# Patient Record
Sex: Female | Born: 1935 | Race: White | Hispanic: No | Marital: Single | Smoking: Never smoker
Health system: Southern US, Community
[De-identification: ages and names within clinical notes are randomized; demographics above are authoritative.]

## PROBLEM LIST (undated history)

## (undated) DIAGNOSIS — K909 Intestinal malabsorption, unspecified: Secondary | ICD-10-CM

## (undated) DIAGNOSIS — I4891 Unspecified atrial fibrillation: Secondary | ICD-10-CM

## (undated) DIAGNOSIS — E079 Disorder of thyroid, unspecified: Secondary | ICD-10-CM

## (undated) DIAGNOSIS — M1990 Unspecified osteoarthritis, unspecified site: Secondary | ICD-10-CM

## (undated) DIAGNOSIS — D631 Anemia in chronic kidney disease: Secondary | ICD-10-CM

## (undated) DIAGNOSIS — D509 Iron deficiency anemia, unspecified: Secondary | ICD-10-CM

## (undated) DIAGNOSIS — I10 Essential (primary) hypertension: Secondary | ICD-10-CM

## (undated) DIAGNOSIS — I5189 Other ill-defined heart diseases: Secondary | ICD-10-CM

## (undated) DIAGNOSIS — E7800 Pure hypercholesterolemia, unspecified: Secondary | ICD-10-CM

## (undated) DIAGNOSIS — M109 Gout, unspecified: Secondary | ICD-10-CM

## (undated) DIAGNOSIS — H9190 Unspecified hearing loss, unspecified ear: Secondary | ICD-10-CM

## (undated) DIAGNOSIS — D696 Thrombocytopenia, unspecified: Secondary | ICD-10-CM

## (undated) DIAGNOSIS — E119 Type 2 diabetes mellitus without complications: Secondary | ICD-10-CM

## (undated) DIAGNOSIS — I350 Nonrheumatic aortic (valve) stenosis: Secondary | ICD-10-CM

## (undated) HISTORY — PX: DOPPLER ECHOCARDIOGRAPHY: SHX263

## (undated) HISTORY — DX: Type 2 diabetes mellitus without complications: E11.9

## (undated) HISTORY — DX: Essential (primary) hypertension: I10

## (undated) HISTORY — DX: Intestinal malabsorption, unspecified: K90.9

## (undated) HISTORY — DX: Nonrheumatic aortic (valve) stenosis: I35.0

## (undated) HISTORY — DX: Other ill-defined heart diseases: I51.89

## (undated) HISTORY — DX: Iron deficiency anemia, unspecified: D50.9

## (undated) HISTORY — DX: Anemia in chronic kidney disease: D63.1

## (undated) HISTORY — PX: OTHER SURGICAL HISTORY: SHX169

---

## 1997-10-12 ENCOUNTER — Other Ambulatory Visit: Admission: RE | Admit: 1997-10-12 | Discharge: 1997-10-12 | Payer: Self-pay | Admitting: Family Medicine

## 2008-02-10 ENCOUNTER — Encounter: Payer: Self-pay | Admitting: *Deleted

## 2009-02-24 ENCOUNTER — Emergency Department (HOSPITAL_BASED_OUTPATIENT_CLINIC_OR_DEPARTMENT_OTHER): Admission: EM | Admit: 2009-02-24 | Discharge: 2009-02-25 | Payer: Self-pay | Admitting: Emergency Medicine

## 2009-03-12 ENCOUNTER — Other Ambulatory Visit (HOSPITAL_COMMUNITY): Payer: Self-pay | Admitting: Emergency Medicine

## 2009-03-12 ENCOUNTER — Inpatient Hospital Stay (HOSPITAL_COMMUNITY): Admission: RE | Admit: 2009-03-12 | Discharge: 2009-03-16 | Payer: Self-pay | Admitting: Internal Medicine

## 2009-03-12 ENCOUNTER — Other Ambulatory Visit: Payer: Self-pay | Admitting: Emergency Medicine

## 2009-03-12 ENCOUNTER — Encounter (INDEPENDENT_AMBULATORY_CARE_PROVIDER_SITE_OTHER): Payer: Self-pay | Admitting: Internal Medicine

## 2009-05-16 ENCOUNTER — Emergency Department (HOSPITAL_BASED_OUTPATIENT_CLINIC_OR_DEPARTMENT_OTHER): Admission: EM | Admit: 2009-05-16 | Discharge: 2009-05-16 | Payer: Self-pay | Admitting: Emergency Medicine

## 2009-05-16 ENCOUNTER — Ambulatory Visit: Payer: Self-pay | Admitting: Radiology

## 2009-05-21 ENCOUNTER — Encounter: Admission: RE | Admit: 2009-05-21 | Discharge: 2009-05-30 | Payer: Self-pay | Admitting: Orthopedic Surgery

## 2010-02-07 ENCOUNTER — Inpatient Hospital Stay (HOSPITAL_COMMUNITY): Admission: EM | Admit: 2010-02-07 | Discharge: 2009-07-03 | Payer: Self-pay | Admitting: Emergency Medicine

## 2010-03-24 ENCOUNTER — Encounter: Payer: Self-pay | Admitting: Family Medicine

## 2010-05-19 LAB — CBC
Hemoglobin: 11.1 g/dL — ABNORMAL LOW (ref 12.0–15.0)
Hemoglobin: 12.8 g/dL (ref 12.0–15.0)
MCHC: 34.1 g/dL (ref 30.0–36.0)
MCHC: 34.3 g/dL (ref 30.0–36.0)
MCV: 99 fL (ref 78.0–100.0)
Platelets: 170 10*3/uL (ref 150–400)
RBC: 3.39 MIL/uL — ABNORMAL LOW (ref 3.87–5.11)
RDW: 13.3 % (ref 11.5–15.5)
WBC: 4.8 10*3/uL (ref 4.0–10.5)
WBC: 6 10*3/uL (ref 4.0–10.5)

## 2010-05-19 LAB — COMPREHENSIVE METABOLIC PANEL
ALT: 19 U/L (ref 0–35)
ALT: 8 U/L (ref 0–35)
AST: 11 U/L (ref 0–37)
Albumin: 3.2 g/dL — ABNORMAL LOW (ref 3.5–5.2)
Alkaline Phosphatase: 79 U/L (ref 39–117)
BUN: 57 mg/dL — ABNORMAL HIGH (ref 6–23)
BUN: 72 mg/dL — ABNORMAL HIGH (ref 6–23)
CO2: 15 mEq/L — ABNORMAL LOW (ref 19–32)
Calcium: 8.6 mg/dL (ref 8.4–10.5)
GFR calc Af Amer: 14 mL/min — ABNORMAL LOW (ref >60)
GFR calc non Af Amer: 12 mL/min — ABNORMAL LOW (ref >60)
GFR calc non Af Amer: 13 mL/min — ABNORMAL LOW (ref >60)
GFR calc non Af Amer: 20 mL/min — ABNORMAL LOW (ref >60)
Glucose, Bld: 65 mg/dL — ABNORMAL LOW (ref 70–99)
Glucose, Bld: 65 mg/dL — ABNORMAL LOW (ref 70–99)
Glucose, Bld: 80 mg/dL (ref 70–99)
Potassium: 4.8 mEq/L (ref 3.5–5.1)
Potassium: 5.1 mEq/L (ref 3.5–5.1)
Sodium: 138 mEq/L (ref 135–145)
Total Bilirubin: 0.6 mg/dL (ref 0.3–1.2)
Total Bilirubin: 1.1 mg/dL (ref 0.3–1.2)
Total Protein: 5.4 g/dL — ABNORMAL LOW (ref 6.0–8.3)

## 2010-05-19 LAB — GLUCOSE, CAPILLARY
Glucose-Capillary: 103 mg/dL — ABNORMAL HIGH (ref 70–99)
Glucose-Capillary: 111 mg/dL — ABNORMAL HIGH (ref 70–99)
Glucose-Capillary: 111 mg/dL — ABNORMAL HIGH (ref 70–99)
Glucose-Capillary: 65 mg/dL — ABNORMAL LOW (ref 70–99)
Glucose-Capillary: 65 mg/dL — ABNORMAL LOW (ref 70–99)
Glucose-Capillary: 66 mg/dL — ABNORMAL LOW (ref 70–99)
Glucose-Capillary: 71 mg/dL (ref 70–99)
Glucose-Capillary: 79 mg/dL (ref 70–99)
Glucose-Capillary: 82 mg/dL (ref 70–99)
Glucose-Capillary: 90 mg/dL (ref 70–99)
Glucose-Capillary: 94 mg/dL (ref 70–99)
Glucose-Capillary: 97 mg/dL (ref 70–99)

## 2010-05-19 LAB — URINALYSIS, ROUTINE W REFLEX MICROSCOPIC
Glucose, UA: NEGATIVE mg/dL
Hgb urine dipstick: NEGATIVE
Ketones, ur: 15 mg/dL — AB
Ketones, ur: 15 mg/dL — AB
Nitrite: NEGATIVE
Protein, ur: NEGATIVE mg/dL
Specific Gravity, Urine: 1.024 (ref 1.005–1.030)
Urobilinogen, UA: 0.2 mg/dL (ref 0.0–1.0)
pH: 5 (ref 5.0–8.0)

## 2010-05-19 LAB — BASIC METABOLIC PANEL
BUN: 26 mg/dL — ABNORMAL HIGH (ref 6–23)
CO2: 23 mEq/L (ref 19–32)
CO2: 24 mEq/L (ref 19–32)
Calcium: 8.8 mg/dL (ref 8.4–10.5)
Chloride: 110 mEq/L (ref 96–112)
Chloride: 115 mEq/L — ABNORMAL HIGH (ref 96–112)
Creatinine, Ser: 1.67 mg/dL — ABNORMAL HIGH (ref 0.4–1.2)
GFR calc Af Amer: 36 mL/min — ABNORMAL LOW (ref >60)
GFR calc non Af Amer: 40 mL/min — ABNORMAL LOW (ref >60)
Glucose, Bld: 93 mg/dL (ref 70–99)
Glucose, Bld: 93 mg/dL (ref 70–99)
Potassium: 4 mEq/L (ref 3.5–5.1)
Potassium: 4.2 mEq/L (ref 3.5–5.1)
Sodium: 138 mEq/L (ref 135–145)
Sodium: 141 mEq/L (ref 135–145)
Sodium: 144 mEq/L (ref 135–145)

## 2010-05-19 LAB — PHOSPHORUS: Phosphorus: 3.3 mg/dL (ref 2.3–4.6)

## 2010-05-19 LAB — DIFFERENTIAL
Basophils Absolute: 0 10*3/uL (ref 0.0–0.1)
Basophils Absolute: 0 10*3/uL (ref 0.0–0.1)
Eosinophils Absolute: 0.2 10*3/uL (ref 0.0–0.7)
Eosinophils Relative: 4 % (ref 0–5)
Lymphs Abs: 1.1 10*3/uL (ref 0.7–4.0)
Monocytes Absolute: 0.5 10*3/uL (ref 0.1–1.0)
Monocytes Relative: 9 % (ref 3–12)
Neutro Abs: 3.2 10*3/uL (ref 1.7–7.7)
Neutro Abs: 4.1 10*3/uL (ref 1.7–7.7)
Neutrophils Relative %: 64 % (ref 43–77)
Neutrophils Relative %: 69 % (ref 43–77)

## 2010-05-19 LAB — PROTEIN ELECTROPHORESIS, SERUM
Alpha-1-Globulin: 5.4 % — ABNORMAL HIGH (ref 2.9–4.9)
Alpha-2-Globulin: 15.2 % — ABNORMAL HIGH (ref 7.1–11.8)
Beta 2: 4.9 % (ref 3.2–6.5)

## 2010-05-19 LAB — CARDIAC PANEL(CRET KIN+CKTOT+MB+TROPI)
CK, MB: 1.8 ng/mL (ref 0.3–4.0)
Relative Index: INVALID (ref 0.0–2.5)
Total CK: 33 U/L (ref 7–177)

## 2010-05-19 LAB — UIFE/LIGHT CHAINS/TP QN, 24-HR UR
Albumin, U: DETECTED
Alpha 1, Urine: DETECTED — AB
Alpha 2, Urine: DETECTED — AB
Free Kappa Lt Chains,Ur: 18.9 mg/dL — ABNORMAL HIGH (ref 0.04–1.51)
Free Kappa/Lambda Ratio: 3.49 ratio (ref 0.46–4.00)
Free Lambda Lt Chains,Ur: 5.42 mg/dL — ABNORMAL HIGH (ref 0.08–1.01)
Free Lambda Lt Chains,Ur: 5.79 mg/dL — ABNORMAL HIGH (ref 0.08–1.01)
Gamma Globulin, Urine: DETECTED — AB
Time: 24 hours

## 2010-05-21 LAB — COMPREHENSIVE METABOLIC PANEL
Albumin: 3.6 g/dL (ref 3.5–5.2)
BUN: 25 mg/dL — ABNORMAL HIGH (ref 6–23)
Creatinine, Ser: 1.89 mg/dL — ABNORMAL HIGH (ref 0.4–1.2)
Total Protein: 5.3 g/dL — ABNORMAL LOW (ref 6.0–8.3)

## 2010-05-21 LAB — URINALYSIS, ROUTINE W REFLEX MICROSCOPIC
Nitrite: NEGATIVE
Specific Gravity, Urine: 1.007 (ref 1.005–1.030)
Urobilinogen, UA: 0.2 mg/dL (ref 0.0–1.0)

## 2010-05-21 LAB — DIFFERENTIAL
Basophils Absolute: 0 10*3/uL (ref 0.0–0.1)
Basophils Relative: 0 % (ref 0–1)
Monocytes Relative: 9 % (ref 3–12)
Neutro Abs: 2.8 10*3/uL (ref 1.7–7.7)
Neutrophils Relative %: 71 % (ref 43–77)

## 2010-05-21 LAB — VITAMIN B12: Vitamin B-12: 1280 pg/mL — ABNORMAL HIGH (ref 211–911)

## 2010-05-21 LAB — GLUCOSE, CAPILLARY
Glucose-Capillary: 143 mg/dL — ABNORMAL HIGH (ref 70–99)
Glucose-Capillary: 79 mg/dL (ref 70–99)
Glucose-Capillary: 95 mg/dL (ref 70–99)

## 2010-05-21 LAB — URINE MICROSCOPIC-ADD ON

## 2010-05-21 LAB — TSH: TSH: 4.298 u[IU]/mL (ref 0.350–4.500)

## 2010-05-21 LAB — RETICULOCYTES: RBC.: 3.01 MIL/uL — ABNORMAL LOW (ref 3.87–5.11)

## 2010-05-21 LAB — BASIC METABOLIC PANEL
BUN: 24 mg/dL — ABNORMAL HIGH (ref 6–23)
CO2: 20 mEq/L (ref 19–32)
Calcium: 8.5 mg/dL (ref 8.4–10.5)
Chloride: 100 mEq/L (ref 96–112)
Creatinine, Ser: 1.79 mg/dL — ABNORMAL HIGH (ref 0.4–1.2)
Creatinine, Ser: 1.94 mg/dL — ABNORMAL HIGH (ref 0.4–1.2)
GFR calc Af Amer: 30 mL/min — ABNORMAL LOW (ref >60)
Glucose, Bld: 111 mg/dL — ABNORMAL HIGH (ref 70–99)

## 2010-05-21 LAB — CORTISOL: Cortisol, Plasma: 5.6 ug/dL

## 2010-05-21 LAB — IRON AND TIBC
Saturation Ratios: 34 % (ref 20–55)
TIBC: 230 ug/dL — ABNORMAL LOW (ref 250–470)

## 2010-05-21 LAB — CBC
MCHC: 34.9 g/dL (ref 30.0–36.0)
RBC: 2.87 MIL/uL — ABNORMAL LOW (ref 3.87–5.11)
RDW: 13.5 % (ref 11.5–15.5)

## 2010-05-21 LAB — FERRITIN: Ferritin: 424 ng/mL — ABNORMAL HIGH (ref 10–291)

## 2010-05-21 LAB — FOLATE: Folate: >20 ng/mL

## 2010-05-21 LAB — OSMOLALITY, URINE: Osmolality, Ur: 194 mOsm/kg — ABNORMAL LOW (ref 390–1090)

## 2010-05-27 LAB — POCT CARDIAC MARKERS
CKMB, poc: <1 ng/mL — ABNORMAL LOW (ref 1.0–8.0)
Troponin i, poc: <0.05 ng/mL (ref 0.00–0.09)

## 2010-05-27 LAB — DIFFERENTIAL
Eosinophils Relative: 3 % (ref 0–5)
Lymphocytes Relative: 18 % (ref 12–46)
Lymphs Abs: 0.9 10*3/uL (ref 0.7–4.0)
Monocytes Absolute: 0.6 10*3/uL (ref 0.1–1.0)
Monocytes Relative: 11 % (ref 3–12)
Neutro Abs: 3.5 10*3/uL (ref 1.7–7.7)

## 2010-05-27 LAB — URINALYSIS, ROUTINE W REFLEX MICROSCOPIC
Bilirubin Urine: NEGATIVE
Glucose, UA: NEGATIVE mg/dL
Ketones, ur: NEGATIVE mg/dL
Specific Gravity, Urine: 1.009 (ref 1.005–1.030)
pH: 5 (ref 5.0–8.0)

## 2010-05-27 LAB — CBC
HCT: 32.3 % — ABNORMAL LOW (ref 36.0–46.0)
Hemoglobin: 10.9 g/dL — ABNORMAL LOW (ref 12.0–15.0)
RBC: 3.24 MIL/uL — ABNORMAL LOW (ref 3.87–5.11)
WBC: 5.1 10*3/uL (ref 4.0–10.5)

## 2010-05-27 LAB — BASIC METABOLIC PANEL
GFR calc Af Amer: 38 mL/min — ABNORMAL LOW (ref >60)
GFR calc non Af Amer: 31 mL/min — ABNORMAL LOW (ref >60)
Glucose, Bld: 73 mg/dL (ref 70–99)
Potassium: 4.8 mEq/L (ref 3.5–5.1)
Sodium: 140 mEq/L (ref 135–145)

## 2011-08-02 ENCOUNTER — Encounter: Payer: Self-pay | Admitting: *Deleted

## 2011-09-03 ENCOUNTER — Encounter: Payer: Self-pay | Admitting: *Deleted

## 2011-11-13 ENCOUNTER — Other Ambulatory Visit: Payer: Self-pay | Admitting: Orthopedic Surgery

## 2011-11-13 DIAGNOSIS — M25512 Pain in left shoulder: Secondary | ICD-10-CM

## 2011-11-19 ENCOUNTER — Ambulatory Visit
Admission: RE | Admit: 2011-11-19 | Discharge: 2011-11-19 | Disposition: A | Discharge status: Home / Self Care | Payer: Medicare Other | Source: Ambulatory Visit | Attending: Orthopedic Surgery | Admitting: Orthopedic Surgery

## 2011-11-19 DIAGNOSIS — M25512 Pain in left shoulder: Secondary | ICD-10-CM

## 2017-04-15 ENCOUNTER — Encounter (HOSPITAL_BASED_OUTPATIENT_CLINIC_OR_DEPARTMENT_OTHER): Payer: Self-pay

## 2017-04-15 ENCOUNTER — Other Ambulatory Visit: Payer: Self-pay

## 2017-04-15 ENCOUNTER — Emergency Department (HOSPITAL_BASED_OUTPATIENT_CLINIC_OR_DEPARTMENT_OTHER)
Admission: EM | Admit: 2017-04-15 | Discharge: 2017-04-16 | Disposition: A | Discharge status: Home / Self Care | Payer: Medicare HMO | Attending: Emergency Medicine | Admitting: Emergency Medicine

## 2017-04-15 DIAGNOSIS — E039 Hypothyroidism, unspecified: Secondary | ICD-10-CM | POA: Insufficient documentation

## 2017-04-15 DIAGNOSIS — Z7984 Long term (current) use of oral hypoglycemic drugs: Secondary | ICD-10-CM | POA: Insufficient documentation

## 2017-04-15 DIAGNOSIS — W228XXA Striking against or struck by other objects, initial encounter: Secondary | ICD-10-CM | POA: Diagnosis not present

## 2017-04-15 DIAGNOSIS — Z79899 Other long term (current) drug therapy: Secondary | ICD-10-CM | POA: Diagnosis not present

## 2017-04-15 DIAGNOSIS — Z23 Encounter for immunization: Secondary | ICD-10-CM | POA: Diagnosis not present

## 2017-04-15 DIAGNOSIS — Y999 Unspecified external cause status: Secondary | ICD-10-CM | POA: Diagnosis not present

## 2017-04-15 DIAGNOSIS — E119 Type 2 diabetes mellitus without complications: Secondary | ICD-10-CM | POA: Insufficient documentation

## 2017-04-15 DIAGNOSIS — S81811A Laceration without foreign body, right lower leg, initial encounter: Secondary | ICD-10-CM | POA: Diagnosis not present

## 2017-04-15 DIAGNOSIS — S8991XA Unspecified injury of right lower leg, initial encounter: Secondary | ICD-10-CM | POA: Diagnosis present

## 2017-04-15 DIAGNOSIS — I10 Essential (primary) hypertension: Secondary | ICD-10-CM | POA: Diagnosis not present

## 2017-04-15 DIAGNOSIS — Y9301 Activity, walking, marching and hiking: Secondary | ICD-10-CM | POA: Diagnosis not present

## 2017-04-15 DIAGNOSIS — Z7982 Long term (current) use of aspirin: Secondary | ICD-10-CM | POA: Insufficient documentation

## 2017-04-15 DIAGNOSIS — Y929 Unspecified place or not applicable: Secondary | ICD-10-CM | POA: Diagnosis not present

## 2017-04-15 HISTORY — DX: Gout, unspecified: M10.9

## 2017-04-15 HISTORY — DX: Unspecified osteoarthritis, unspecified site: M19.90

## 2017-04-15 HISTORY — DX: Pure hypercholesterolemia, unspecified: E78.00

## 2017-04-15 HISTORY — DX: Thrombocytopenia, unspecified: D69.6

## 2017-04-15 HISTORY — DX: Unspecified atrial fibrillation: I48.91

## 2017-04-15 HISTORY — DX: Disorder of thyroid, unspecified: E07.9

## 2017-04-15 HISTORY — DX: Unspecified hearing loss, unspecified ear: H91.90

## 2017-04-16 ENCOUNTER — Encounter (HOSPITAL_BASED_OUTPATIENT_CLINIC_OR_DEPARTMENT_OTHER): Payer: Self-pay | Admitting: Student

## 2017-04-16 MED ORDER — TETANUS-DIPHTH-ACELL PERTUSSIS 5-2.5-18.5 LF-MCG/0.5 IM SUSP
0.5000 mL | Freq: Once | INTRAMUSCULAR | Status: AC
  Administered 2017-04-16: 0.5 mL via INTRAMUSCULAR
  Filled 2017-04-16: qty 0.5

## 2017-04-16 NOTE — ED Notes (Signed)
Removed pressure dressing, no further bleeding at this time.  CLeaned old blood off around wound.  Placed some steri strips to secure wound and placed non stick dressing over this and then placed some gauze and ace wrap to protect the wound.

## 2017-04-16 NOTE — ED Provider Notes (Signed)
[facility] EMERGENCY DEPARTMENT Provider Note   CSN: [id number] Arrival date & time: 04/15/17  2342     History   Chief Complaint Chief Complaint  Patient presents with  . Laceration   HPI Faith Holmes is a 84 y.o. female with a history of Afib on Xarelto, hypertension and diabetes who presents to the emergency department with skin tear that occurred shortly prior to arrival this evening. Patient was ambulating when she bumped her right lower leg on the corner of an end table causing the skin tear. She was unable to stop the bleeding prompting her ED visit. The problem is constant, no alleviating/aggravating factors. Patient did not have a fall or any other injuries related to this incident. Denies numbness, tingling, or weakness. Unknown last tetanus.  HPI  Past Medical History:  Diagnosis Date  . Arthritis   . Atrial fibrillation ([place])   . Diabetes mellitus ([place])   . Diastolic dysfunction   . Gout   . Hard of hearing   . High cholesterol   . Hypertension   . Mild aortic stenosis   . Thrombocytopenia ([place])   . Thyroid disease    hypothyroidism    There are no active problems to display for this patient.   Past Surgical History:  Procedure Laterality Date  . DOPPLER ECHOCARDIOGRAPHY     mild diastolic dysfuntion, trivial mitral and tricuspid valve regurgitation  . Mild-moderate AV stenosis      OB History    No data available       Home Medications    Prior to Admission medications   Medication Sig Start Date End Date Taking? Authorizing Provider  aspirin 81 MG tablet Take 81 mg by mouth daily.    [provider]  carvedilol (COREG) 12.5 MG tablet Take 12.5 mg by mouth 2 (two) times daily with a meal.    [provider]  celecoxib (CELEBREX) 200 MG capsule Take 200 mg by mouth daily.    [provider]  cyanocobalamin 100 MCG tablet Take 100 mcg by mouth daily.    [provider]  levothyroxine (SYNTHROID,  LEVOTHROID) 50 MCG tablet Take 50 mcg by mouth daily.    [provider]  metFORMIN (GLUCOPHAGE) 500 MG tablet Take 500 mg by mouth 2 (two) times daily with a meal.    [provider]  Multiple Vitamins-Iron (ONE-TABLET-DAILY/IRON PO) Take by mouth.    [provider]  simvastatin (ZOCOR) 20 MG tablet Take 20 mg by mouth every evening.    [provider]  telmisartan-hydrochlorothiazide (MICARDIS HCT) 80-25 MG per tablet Take 1 tablet by mouth daily.    [provider]    Family History History reviewed. No pertinent family history.  Social History Social History   Tobacco Use  . Smoking status: Not on file  Substance Use Topics  . Alcohol use: Not on file  . Drug use: Not on file     Allergies   Altace [ramipril]; Codeine; and Daypro [oxaprozin]   Review of Systems Review of Systems  Respiratory: Negative for shortness of breath.   Cardiovascular: Negative for chest pain.  Skin: Positive for wound.  Neurological: Negative for dizziness, weakness, light-headedness, numbness and headaches.   Physical Exam Updated Vital Signs BP (!) 189/99 (BP Location: Right Arm)   Pulse 81   Temp 98.2 F (36.8 C) (Oral)   Resp 18   Ht 5\' 2"  (1.575 m)   Wt 57.6 kg (127 lb)  SpO2 99%   BMI 23.23 kg/m   Physical Exam  Constitutional: She appears well-developed and well-nourished. No distress.  HENT:  Head: Normocephalic and atraumatic.  Eyes: Conjunctivae are normal. Right eye exhibits no discharge. Left eye exhibits no discharge.  Cardiovascular:  Pulses:      Dorsalis pedis pulses are 2+ on the right side, and 2+ on the left side.  Neurological: She is alert.  Clear speech. 5/5 strength with plantar/dorsiflexion bilaterally. Sensation grossly intact to bilateral lower extremities.   Skin:  RLE: There is a 4x4 cm skin tear with flap to the anterolateral lower leg. There is some active bleeding at time of initial evaluation.    Psychiatric: She has a normal mood and affect. Her behavior is normal. Thought content normal.  Nursing note and vitals reviewed.  ED Treatments / Results  Labs (all labs ordered are listed, but only abnormal results are displayed) Labs Reviewed - No data to display  EKG  EKG Interpretation None       Radiology No results found.  Procedures Procedures (including critical care time)  Medications Ordered in ED Medications  Tdap (BOOSTRIX) injection 0.5 mL (not administered)     Initial Impression / Assessment and Plan / ED Course  I have reviewed the triage vital signs and the nursing notes.  Pertinent labs & imaging results that were available during my care of the patient were reviewed by me and considered in my medical decision making (see chart for details).  Patient on Xarelto presents with skin tear with active bleeding. She is nontoxic appearing, in no apparent, vitals WNL other than elevated BP- no indication of HTN emergency- discussed need for recheck by PCP with patient and family. RN applied pressure dressing, this was removed with resolution of bleeding. Area was cleaned, steri strips were placed to secure skin tear closure. Overlying nonstick gauze and ace wrap were applied. Bleeding resolved, no indication for sutures, tetanus updated at visit. Patient is NVI distal to wound. I discussed need for PCP follow-up, and return precautions with the patient and her family. Provided opportunity for questions, patient and family confirmed understanding and are in agreement with plan.     Findings and plan of care discussed with supervising physician Dr. Florina Ou who personally evaluated and examined this patient and is in agreement with plan.    Final Clinical Impressions(s) / ED Diagnoses   Final diagnoses:  Noninfected skin tear of right lower extremity, initial encounter    ED Discharge Orders    None       Amaryllis Dyke, PA-C 04/16/17 0049     Shanon Rosser, MD 04/16/17 [phone]

## 2017-04-16 NOTE — Discharge Instructions (Signed)
You were seen in the emergency department for a skin tear to your right leg. The bleeding was stopped and a bandage was placed.   Your tetanus was updated during your visit today.   Your blood pressure was elevated in the emergency department. Have this rechecked by your primary care provider in 1 week.   Vitals:   04/15/17 2349  BP: (!) 189/99  Pulse: 81  Resp: 18  Temp: 98.2 F (36.8 C)  SpO2: 99%     Return to the emergency department for any new or worsening symptoms including but not limited to recurrence of the bleeding that will not stop, redness/drainage from the wound, chest pain, difficulty breathing, dizziness, lightheadedness, headaches, or any other concerns you may have.

## 2017-04-16 NOTE — ED Triage Notes (Signed)
Pt has a large skin tear from accidentally walking into the corner of an end table at [employer].  She is on xarelto and is unable to get the bleeding to stop.

## 2017-05-27 ENCOUNTER — Other Ambulatory Visit: Payer: Self-pay

## 2017-05-27 ENCOUNTER — Inpatient Hospital Stay (HOSPITAL_BASED_OUTPATIENT_CLINIC_OR_DEPARTMENT_OTHER): Payer: Medicare HMO | Admitting: Hematology & Oncology

## 2017-05-27 ENCOUNTER — Inpatient Hospital Stay: Payer: Medicare HMO | Attending: Hematology & Oncology

## 2017-05-27 VITALS — BP 170/73 | HR 77 | Temp 98.2°F | Resp 20 | Wt 127.0 lb

## 2017-05-27 DIAGNOSIS — K909 Intestinal malabsorption, unspecified: Secondary | ICD-10-CM

## 2017-05-27 DIAGNOSIS — N289 Disorder of kidney and ureter, unspecified: Secondary | ICD-10-CM | POA: Diagnosis not present

## 2017-05-27 DIAGNOSIS — I4891 Unspecified atrial fibrillation: Secondary | ICD-10-CM | POA: Insufficient documentation

## 2017-05-27 DIAGNOSIS — D500 Iron deficiency anemia secondary to blood loss (chronic): Secondary | ICD-10-CM

## 2017-05-27 DIAGNOSIS — D649 Anemia, unspecified: Secondary | ICD-10-CM | POA: Diagnosis present

## 2017-05-27 DIAGNOSIS — D631 Anemia in chronic kidney disease: Secondary | ICD-10-CM

## 2017-05-27 DIAGNOSIS — D508 Other iron deficiency anemias: Secondary | ICD-10-CM

## 2017-05-27 LAB — CBC WITH DIFFERENTIAL (CANCER CENTER ONLY)
BASOS PCT: 0 %
Basophils Absolute: 0 10*3/uL (ref 0.0–0.1)
EOS PCT: 6 %
Eosinophils Absolute: 0.2 10*3/uL (ref 0.0–0.5)
HEMATOCRIT: 34 % — AB (ref 34.8–46.6)
Hemoglobin: 10.9 g/dL — ABNORMAL LOW (ref 11.6–15.9)
Lymphocytes Relative: 31 %
Lymphs Abs: 1.1 10*3/uL (ref 0.9–3.3)
MCH: 31 pg (ref 26.0–34.0)
MCHC: 32.1 g/dL (ref 32.0–36.0)
MCV: 96.6 fL (ref 81.0–101.0)
MONOS PCT: 9 %
Monocytes Absolute: 0.3 10*3/uL (ref 0.1–0.9)
NEUTROS ABS: 2 10*3/uL (ref 1.5–6.5)
Neutrophils Relative %: 54 %
PLATELETS: 121 10*3/uL — AB (ref 145–400)
RBC: 3.52 MIL/uL — ABNORMAL LOW (ref 3.70–5.32)
RDW: 13.2 % (ref 11.1–15.7)
WBC Count: 3.7 10*3/uL — ABNORMAL LOW (ref 3.9–10.0)

## 2017-05-27 LAB — CMP (CANCER CENTER ONLY)
ALBUMIN: 3.7 g/dL (ref 3.5–5.0)
ALT: 16 U/L (ref 10–47)
ANION GAP: 5 (ref 5–15)
AST: 23 U/L (ref 11–38)
Alkaline Phosphatase: 67 U/L (ref 26–84)
BUN: 19 mg/dL (ref 7–22)
CALCIUM: 8.7 mg/dL (ref 8.0–10.3)
CO2: 33 mmol/L (ref 18–33)
Chloride: 103 mmol/L (ref 98–108)
Creatinine: 1.6 mg/dL — ABNORMAL HIGH (ref 0.60–1.20)
GLUCOSE: 93 mg/dL (ref 73–118)
Potassium: 2.8 mmol/L — CL (ref 3.3–4.7)
SODIUM: 141 mmol/L (ref 128–145)
TOTAL PROTEIN: 6.2 g/dL — AB (ref 6.4–8.1)
Total Bilirubin: 0.5 mg/dL (ref 0.2–1.6)

## 2017-05-27 LAB — SAVE SMEAR

## 2017-05-28 LAB — KAPPA/LAMBDA LIGHT CHAINS
Kappa free light chain: 52 mg/L — ABNORMAL HIGH (ref 3.3–19.4)
Kappa, lambda light chain ratio: 1.47 (ref 0.26–1.65)
Lambda free light chains: 35.4 mg/L — ABNORMAL HIGH (ref 5.7–26.3)

## 2017-05-28 LAB — ERYTHROPOIETIN: Erythropoietin: 18.3 m[IU]/mL (ref 2.6–18.5)

## 2017-05-28 LAB — IGG, IGA, IGM
IGA: 187 mg/dL (ref 64–422)
IGM (IMMUNOGLOBULIN M), SRM: 53 mg/dL (ref 26–217)
IgG (Immunoglobin G), Serum: 963 mg/dL (ref 700–1600)

## 2017-05-28 LAB — RETICULOCYTES
RBC.: 3.52 MIL/uL — ABNORMAL LOW (ref 3.87–5.11)
Retic Count, Absolute: 45.8 10*3/uL (ref 19.0–186.0)
Retic Ct Pct: 1.3 % (ref 0.4–3.1)

## 2017-05-28 LAB — IRON AND TIBC
IRON: 39 ug/dL — AB (ref 41–142)
Saturation Ratios: 19 % — ABNORMAL LOW (ref 21–57)
TIBC: 206 ug/dL — ABNORMAL LOW (ref 236–444)
UIBC: 167 ug/dL

## 2017-05-28 LAB — FERRITIN: Ferritin: 165 ng/mL (ref 9–269)

## 2017-05-28 NOTE — Progress Notes (Signed)
Referral MD  Reason for Referral: Anemia-normochromic and normocytic  Chief Complaint  Patient presents with  . New Patient (Initial Visit)  : I have low blood.  HPI: Ms. Witte is a very charming 84 year old white female.  She actually is the mother of 1 of our patients.  She actually has been doing fairly well.  She lives by herself.  Her husband passed away about 15 years ago.  She does have history of atrial fibrillation.  She is on Xarelto.  She has had no obvious bleeding.  I am not sure what medicine she actually is taking.  Her daughter-in-law comes in with her.  Again, she is had no bleeding.  There is been no problems with weight gain or weight loss.  She really does not eat much.  Her daughter-in-law continues to try to get her to eat more.  I think this certainly could be an issue with her anemia.  She has had no nausea or vomiting.  She does not smoke.  She does not drink.  She used to work for the [employer] down in [place].  She is not sure what medication she actually is taking.  Lab work that we have back from November 2018 shows a white cell count 6.1.  Hemoglobin 10.2.  Hematocrit 31.8.  Platelet count 162,000.  Her MCV is 93.  Back in May 2018, her hemoglobin was 11.5 with hematocrit of 35.7.  Her platelet count was little bit low at 108,000.  In February 2019, her white cell count was 4.1.  Hemoglobin 9.9.  Hematocrit 30.8.  Platelet count 142,000.  MCV is 93.  She had iron studies done back in May of last year, her ferritin was 297 with iron saturation of 31%.  Her vitamin B12 level back in May 2018 was 1600.  She does have some mild renal insufficiency.  Her creatinine was 1.46 a week or so ago.  Her creatinine clearance was 37 cc/min.  She does have low potassium.  She has not noted any palpable lymph nodes.  Is been a while since she had a mammogram or a colonoscopy.  Overall, her performance status is ECOG 3.    Past Medical  History:  Diagnosis Date  . Arthritis   . Atrial fibrillation ([place])   . Diabetes mellitus ([place])   . Diastolic dysfunction   . Gout   . Hard of hearing   . High cholesterol   . Hypertension   . Mild aortic stenosis   . Thrombocytopenia ([place])   . Thyroid disease    hypothyroidism  :  Past Surgical History:  Procedure Laterality Date  . DOPPLER ECHOCARDIOGRAPHY     mild diastolic dysfuntion, trivial mitral and tricuspid valve regurgitation  . Mild-moderate AV stenosis    :   Current Outpatient Medications:  .  carvedilol (COREG) 12.5 MG tablet, Take 12.5 mg by mouth 2 (two) times daily with a meal., Disp: , Rfl:  .  Cholecalciferol (VITAMIN D3) 1000 units CAPS, Take 1,000 Units by mouth daily., Disp: , Rfl:  .  Cyanocobalamin (VITAMIN B 12 PO), Take by mouth daily., Disp: , Rfl:  .  hydrochlorothiazide (HYDRODIURIL) 25 MG tablet, Take 25 mg by mouth daily., Disp: , Rfl:  .  iron polysaccharides (NIFEREX) 150 MG capsule, Take 150 mg by mouth daily., Disp: , Rfl:  .  levothyroxine (SYNTHROID, LEVOTHROID) 50 MCG tablet, Take 50 mcg by mouth daily., Disp: , Rfl:  .  Rivaroxaban (XARELTO) 15 MG TABS tablet,  Take 15 mg by mouth daily with supper., Disp: , Rfl: :  :  Allergies  Allergen Reactions  . Altace [Ramipril]   . Codeine   . Daypro [Oxaprozin]   :  No family history on file.:  Social History   Socioeconomic History  . Marital status: Single    Spouse name: Not on file  . Number of children: Not on file  . Years of education: Not on file  . Highest education level: Not on file  Occupational History  . Not on file  Social Needs  . Financial resource strain: Not on file  . Food insecurity:    Worry: Not on file    Inability: Not on file  . Transportation needs:    Medical: Not on file    Non-medical: Not on file  Tobacco Use  . Smoking status: Not on file  Substance and Sexual Activity  . Alcohol use: Not on file  . Drug use: Not on file  . Sexual  activity: Not on file  Lifestyle  . Physical activity:    Days per week: Not on file    Minutes per session: Not on file  . Stress: Not on file  Relationships  . Social connections:    Talks on phone: Not on file    Gets together: Not on file    Attends religious service: Not on file    Active member of club or organization: Not on file    Attends meetings of clubs or organizations: Not on file    Relationship status: Not on file  . Intimate partner violence:    Fear of current or ex partner: Not on file    Emotionally abused: Not on file    Physically abused: Not on file    Forced sexual activity: Not on file  Other Topics Concern  . Not on file  Social History Narrative  . Not on file  :  Review of Systems  Constitutional: Positive for malaise/fatigue.  HENT: Positive for congestion.   Eyes: Negative.   Respiratory: Negative.   Cardiovascular: Positive for palpitations and leg swelling.  Gastrointestinal: Negative.   Genitourinary: Negative.   Musculoskeletal: Positive for joint pain.  Skin: Negative.   Neurological: Negative.   Endo/Heme/Allergies: Negative.   Psychiatric/Behavioral: Negative.      Exam: Elderly white female in no obvious distress.  Vital signs show a temperature of 98.2.  Pulse 77.  Blood pressure 170/73.  Weight is 127 pounds.  Head neck exam shows no ocular or oral lesions.  She has no scleral icterus.  Conjunctiva are slightly pale.  She has no adenopathy in the neck.  Thyroid is nonpalpable.  Lungs are clear bilaterally.  Cardiac exam regular rate and rhythm with no murmurs, rubs or bruits.  Abdomen is soft.  There is no palpable fluid wave.  There is no palpable liver or spleen tip.  Back exam shows no tenderness over the spine, ribs or hips.  Extremities shows no clubbing, cyanosis or edema.  She does have some stasis dermatitis changes in her lower extremities.  She has a little bit of edema in the lower legs.  She has some swelling in the right  lower leg.  She does have a Baker's cyst by a recent ultrasound of the leg.  Neurological exam shows no focal neurological deficits.  Skin exam shows no rashes, ecchymoses or petechia outside of the lower extremity stasis dermatitis. _0 @   Recent Labs    05/27/17 1520  WBC 3.7*  HCT 34.0*  PLT 121*   Recent Labs    05/27/17 1520  NA 141  K 2.8*  CL 103  CO2 33  GLUCOSE 93  BUN 19  CREATININE 1.60*  CALCIUM 8.7    Blood smear review: Normochromic and normocytic population of red blood cells.  There are no nucleated red blood cells.  There are no teardrop cells.  I see no rouleaux formation.  She has no inclusion bodies.  There are no schistocytes.  White cells are normal in morphology maturation.  She has no hypersegmented polys.  She has no immature myeloid or lymphoid cells.  Platelets are slightly decreased in number.  Platelets seem to be fairly well granulated.  Pathology: None    Assessment and Plan: Ms. Badolato is a very charming 84 year old white female.  She has mild anemia.  This is normochromic and normocytic anemia.  I would have to think that her likely reason is erythropoietin deficiency.  She has renal insufficiency.  Even though her creatinine is not that elevated, given her age and weight, her creatinine clearance is quite low.  We will see what her erythropoietin level is.  Given that her blood pressure is not that great, we really cannot entertain using ESA right now.  Given her age of 84 years old, myelodysplasia has to be considered.  I really did not see anything on the blood smear that looked like myelodysplasia.  And the only way we would would make a diagnosis with a bone marrow biopsy.  I would not put her through a bone marrow biopsy given her overall performance status.  At this point, I really think we can just watch her.  I think that she does become symptomatic with anemia, and her erythropoietin level is low, then we might consider using  ESA.  I spent about 45 minutes with Ms. Bolanos and her daughter-in-law.  Over half the time was spent face-to-face with them.  We will be in touch with her daughter-in-law who really is a one taking care of her medical problems.

## 2017-05-29 LAB — PROTEIN ELECTROPHORESIS, SERUM, WITH REFLEX
A/G Ratio: 1.5 (ref 0.7–1.7)
ALBUMIN ELP: 3.6 g/dL (ref 2.9–4.4)
ALPHA-1-GLOBULIN: 0.2 g/dL (ref 0.0–0.4)
Alpha-2-Globulin: 0.8 g/dL (ref 0.4–1.0)
BETA GLOBULIN: 0.7 g/dL (ref 0.7–1.3)
GAMMA GLOBULIN: 0.8 g/dL (ref 0.4–1.8)
Globulin, Total: 2.4 g/dL (ref 2.2–3.9)
Total Protein ELP: 6 g/dL (ref 6.0–8.5)

## 2017-07-01 ENCOUNTER — Encounter: Payer: Self-pay | Admitting: Hematology & Oncology

## 2017-07-01 DIAGNOSIS — D631 Anemia in chronic kidney disease: Secondary | ICD-10-CM

## 2017-07-01 DIAGNOSIS — K909 Intestinal malabsorption, unspecified: Secondary | ICD-10-CM | POA: Insufficient documentation

## 2017-07-01 DIAGNOSIS — D509 Iron deficiency anemia, unspecified: Secondary | ICD-10-CM

## 2017-07-01 HISTORY — DX: Anemia in chronic kidney disease: D63.1

## 2017-07-01 HISTORY — DX: Intestinal malabsorption, unspecified: K90.9

## 2017-07-01 HISTORY — DX: Iron deficiency anemia, unspecified: D50.9

## 2017-07-01 NOTE — Addendum Note (Signed)
Addended by: Burney Gauze R on: 07/01/2017 03:24 PM   Modules accepted: Orders

## 2017-07-03 ENCOUNTER — Telehealth: Payer: Self-pay | Admitting: *Deleted

## 2017-07-03 NOTE — Telephone Encounter (Addendum)
Patient is aware of results  ----- Message from Volanda Napoleon, MD sent at 07/01/2017  3:25 PM EDT ----- Call darlene and let her know that her mother in law needs IV Iron.  Laurey Arrow

## 2017-07-07 ENCOUNTER — Other Ambulatory Visit: Payer: Self-pay

## 2017-07-07 ENCOUNTER — Inpatient Hospital Stay: Payer: Medicare HMO | Attending: Hematology & Oncology

## 2017-07-07 VITALS — BP 169/69 | HR 68 | Temp 98.2°F | Resp 20

## 2017-07-07 DIAGNOSIS — D508 Other iron deficiency anemias: Secondary | ICD-10-CM

## 2017-07-07 DIAGNOSIS — K909 Intestinal malabsorption, unspecified: Secondary | ICD-10-CM

## 2017-07-07 DIAGNOSIS — N289 Disorder of kidney and ureter, unspecified: Secondary | ICD-10-CM | POA: Insufficient documentation

## 2017-07-07 DIAGNOSIS — D649 Anemia, unspecified: Secondary | ICD-10-CM | POA: Diagnosis present

## 2017-07-07 MED ORDER — SODIUM CHLORIDE 0.9 % IV SOLN
510.0000 mg | Freq: Once | INTRAVENOUS | Status: AC
  Administered 2017-07-07: 510 mg via INTRAVENOUS
  Filled 2017-07-07: qty 17

## 2017-07-07 MED ORDER — SODIUM CHLORIDE 0.9 % IV SOLN
Freq: Once | INTRAVENOUS | Status: AC
  Administered 2017-07-07: 11:00:00 via INTRAVENOUS

## 2017-07-07 NOTE — Patient Instructions (Signed)

## 2017-07-30 ENCOUNTER — Inpatient Hospital Stay (HOSPITAL_BASED_OUTPATIENT_CLINIC_OR_DEPARTMENT_OTHER): Payer: Medicare HMO | Admitting: Hematology & Oncology

## 2017-07-30 ENCOUNTER — Encounter: Payer: Self-pay | Admitting: Hematology & Oncology

## 2017-07-30 ENCOUNTER — Inpatient Hospital Stay: Payer: Medicare HMO

## 2017-07-30 ENCOUNTER — Other Ambulatory Visit: Payer: Self-pay

## 2017-07-30 VITALS — BP 157/75 | HR 64 | Temp 97.8°F | Resp 20 | Wt 123.0 lb

## 2017-07-30 DIAGNOSIS — D631 Anemia in chronic kidney disease: Secondary | ICD-10-CM

## 2017-07-30 DIAGNOSIS — D649 Anemia, unspecified: Secondary | ICD-10-CM

## 2017-07-30 DIAGNOSIS — K909 Intestinal malabsorption, unspecified: Secondary | ICD-10-CM

## 2017-07-30 DIAGNOSIS — D500 Iron deficiency anemia secondary to blood loss (chronic): Secondary | ICD-10-CM

## 2017-07-30 DIAGNOSIS — D508 Other iron deficiency anemias: Secondary | ICD-10-CM

## 2017-07-30 DIAGNOSIS — N289 Disorder of kidney and ureter, unspecified: Secondary | ICD-10-CM

## 2017-07-30 LAB — CMP (CANCER CENTER ONLY)
ALBUMIN: 4 g/dL (ref 3.5–5.0)
ALK PHOS: 63 U/L (ref 40–150)
ALT: 7 U/L (ref 0–55)
ANION GAP: 8 (ref 3–11)
AST: 17 U/L (ref 5–34)
BUN: 22 mg/dL (ref 7–26)
CALCIUM: 9.4 mg/dL (ref 8.4–10.4)
CO2: 27 mmol/L (ref 22–29)
Chloride: 104 mmol/L (ref 98–109)
Creatinine: 1.59 mg/dL — ABNORMAL HIGH (ref 0.60–1.10)
GFR, Est AFR Am: 32 mL/min — ABNORMAL LOW (ref >60)
GFR, Estimated: 28 mL/min — ABNORMAL LOW (ref >60)
GLUCOSE: 86 mg/dL (ref 70–140)
Potassium: 5.1 mmol/L (ref 3.5–5.1)
SODIUM: 139 mmol/L (ref 136–145)
Total Bilirubin: 0.5 mg/dL (ref 0.2–1.2)
Total Protein: 6.6 g/dL (ref 6.4–8.3)

## 2017-07-30 LAB — CBC WITH DIFFERENTIAL (CANCER CENTER ONLY)
Basophils Absolute: 0 10*3/uL (ref 0.0–0.1)
Basophils Relative: 1 %
EOS PCT: 3 %
Eosinophils Absolute: 0.1 10*3/uL (ref 0.0–0.5)
HCT: 35.5 % (ref 34.8–46.6)
Hemoglobin: 11.5 g/dL — ABNORMAL LOW (ref 11.6–15.9)
LYMPHS PCT: 24 %
Lymphs Abs: 1.1 10*3/uL (ref 0.9–3.3)
MCH: 31.6 pg (ref 26.0–34.0)
MCHC: 32.4 g/dL (ref 32.0–36.0)
MCV: 97.5 fL (ref 81.0–101.0)
MONO ABS: 0.3 10*3/uL (ref 0.1–0.9)
MONOS PCT: 7 %
NEUTROS ABS: 2.9 10*3/uL (ref 1.5–6.5)
Neutrophils Relative %: 65 %
PLATELETS: 101 10*3/uL — AB (ref 145–400)
RBC: 3.64 MIL/uL — ABNORMAL LOW (ref 3.70–5.32)
RDW: 14.3 % (ref 11.1–15.7)
WBC Count: 4.4 10*3/uL (ref 3.9–10.0)

## 2017-07-30 LAB — RETICULOCYTES
RBC.: 3.65 MIL/uL — AB (ref 3.70–5.45)
Retic Count, Absolute: 43.8 10*3/uL (ref 33.7–90.7)
Retic Ct Pct: 1.2 % (ref 0.7–2.1)

## 2017-07-30 LAB — IRON AND TIBC
IRON: 59 ug/dL (ref 41–142)
SATURATION RATIOS: 27 % (ref 21–57)
TIBC: 216 ug/dL — AB (ref 236–444)
UIBC: 157 ug/dL

## 2017-07-30 LAB — FERRITIN: Ferritin: 344 ng/mL — ABNORMAL HIGH (ref 9–269)

## 2017-07-30 LAB — TECHNOLOGIST SMEAR REVIEW

## 2017-07-30 NOTE — Progress Notes (Signed)
Hematology and Oncology Follow Up Visit  Faith Holmes [id number] 28-Nov-1926 84 y.o. 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  We first saw her back in late March.  She had lab work done.  She was found to have a slightly low iron level.  Her ferritin was 165 with an iron saturation of 19%.  We did a SPEP on her.  This did not show any monoclonal spike.  She has a mild degree of renal insufficiency.  Her creatinine was 1.6.  We did an erythropoietin level on her.  It was only 18.3.  Apparently, her family doctor wants her to see a nephrologist.  Not really sure what a nephrologist to go to do for her.  I do not think she is going to "change her ways".  She does not eat much.  I do not think she ever will eat a lot.  She does have issues with high blood pressure.  There is been no obvious bleeding.  She has had no nausea or vomiting.  She is had no leg swelling.  She is had no rashes.  Overall, her performance status is ECOG 3.    Medications:  Current Outpatient Medications:  .  diclofenac sodium (VOLTAREN) 1 % GEL, Place onto the skin., Disp: , Rfl:  .  potassium chloride (MICRO-K) 10 MEQ CR capsule, Take 10 mEq by mouth 2 (two) times daily., Disp: , Rfl:  .  carvedilol (COREG) 12.5 MG tablet, Take 12.5 mg by mouth 2 (two) times daily with a meal., Disp: , Rfl:  .  Cholecalciferol (VITAMIN D3) 1000 units CAPS, Take 1,000 Units by mouth daily., Disp: , Rfl:  .  cyanocobalamin 100 MCG tablet, Take 100 mcg by mouth., Disp: , Rfl:  .  hydrochlorothiazide (HYDRODIURIL) 25 MG tablet, Take 25 mg by mouth daily., Disp: , Rfl:  .  iron polysaccharides (NU-IRON) 150 MG capsule, Take 150 mg by mouth., Disp: , Rfl:  .  levothyroxine (SYNTHROID, LEVOTHROID) 50 MCG tablet, Take 50 mcg by mouth daily.,  Disp: , Rfl:  .  Rivaroxaban (XARELTO) 15 MG TABS tablet, Take 15 mg by mouth daily with supper., Disp: , Rfl:   Allergies:  Allergies  Allergen Reactions  . Codeine Other (See Comments)  . Furosemide Other (See Comments)    Renal  Renal    . Oxaprozin Nausea Only  . Ramipril Cough    Past Medical History, Surgical history, Social history, and Family History were reviewed and updated.  Review of Systems: Review of Systems  Constitutional: Positive for appetite change and fatigue.  HENT:  Negative.   Eyes: Negative.   Respiratory: Negative.   Cardiovascular: Negative.   Gastrointestinal: Negative.   Endocrine: Negative.   Genitourinary: Negative.    Musculoskeletal: Positive for myalgias.  Skin: Negative.   Neurological: Positive for dizziness.  Hematological: Negative.   Psychiatric/Behavioral: Negative.     Physical Exam:  weight is 123 lb (55.8 kg). Her oral temperature is 97.8 F (36.6 C). Her blood pressure is 157/75 (abnormal) and her pulse is 64. Her respiration is 20 and oxygen saturation is 100%.   Wt Readings from Last 3 Encounters:  07/30/17 123 lb (55.8 kg)  05/27/17 127 lb (57.6 kg)  04/15/17 127 lb (57.6 kg)  Physical Exam  Constitutional: She is oriented to person, place, and time.  HENT:  Head: Normocephalic and atraumatic.  Mouth/Throat: Oropharynx is clear and moist.  Eyes: Pupils are equal, round, and reactive to light. EOM are normal.  Neck: Normal range of motion.  Cardiovascular: Normal rate, regular rhythm and normal heart sounds.  Pulmonary/Chest: Effort normal and breath sounds normal.  Abdominal: Soft. Bowel sounds are normal.  Musculoskeletal: Normal range of motion. She exhibits no edema, tenderness or deformity.  Lymphadenopathy:    She has no cervical adenopathy.  Neurological: She is alert and oriented to person, place, and time.  Skin: Skin is warm and dry. No rash noted. No erythema.  Psychiatric: She has a normal mood and  affect. Her behavior is normal. Judgment and thought content normal.  Vitals reviewed.    Lab Results  Component Value Date   WBC 4.4 07/30/2017   HGB 11.5 (L) 07/30/2017   HCT 35.5 07/30/2017   MCV 97.5 07/30/2017   PLT 101 (L) 07/30/2017     Chemistry      Component Value Date/Time   NA 141 05/27/2017 1520   K 2.8 (LL) 05/27/2017 1520   CL 103 05/27/2017 1520   CO2 33 05/27/2017 1520   BUN 19 05/27/2017 1520   CREATININE 1.60 (H) 05/27/2017 1520      Component Value Date/Time   CALCIUM 8.7 05/27/2017 1520   ALKPHOS 67 05/27/2017 1520   AST 23 05/27/2017 1520   ALT 16 05/27/2017 1520   BILITOT 0.5 05/27/2017 1520       Impression and Plan: Faith Holmes is a 84 year old white female.  Again, she has other health issues.  She is mildly anemic.  Her anemia is better with the IV iron.  I would not give her Aranesp.  I think this would have more problems for her.  I am not sure that the way she feels is a reflection of her blood.  I suppose that she may have an element of myelodysplasia.  A bone marrow test is out of the question for her.  I would never put her through a home marrow biopsy as this would not change our recommendations.  She is not a candidate for any aggressive therapy.  I think we can probably get her through the summertime.  I will see her back after Labor Day.  Hopefully, she will not get involved with the law doctors that we will start doing things with her that will make things more confusing for her.   Volanda Napoleon, MD 5/30/201911:24 AM

## 2017-11-26 ENCOUNTER — Inpatient Hospital Stay: Payer: Medicare HMO

## 2017-11-26 ENCOUNTER — Inpatient Hospital Stay: Payer: Medicare HMO | Admitting: Hematology & Oncology

## 2018-08-20 ENCOUNTER — Other Ambulatory Visit: Payer: Self-pay | Admitting: Family

## 2018-08-20 DIAGNOSIS — D500 Iron deficiency anemia secondary to blood loss (chronic): Secondary | ICD-10-CM

## 2018-08-20 DIAGNOSIS — K909 Intestinal malabsorption, unspecified: Secondary | ICD-10-CM

## 2018-08-23 ENCOUNTER — Inpatient Hospital Stay (HOSPITAL_BASED_OUTPATIENT_CLINIC_OR_DEPARTMENT_OTHER): Payer: Medicare HMO | Admitting: Family

## 2018-08-23 ENCOUNTER — Other Ambulatory Visit: Payer: Self-pay

## 2018-08-23 ENCOUNTER — Telehealth: Payer: Self-pay | Admitting: Emergency Medicine

## 2018-08-23 ENCOUNTER — Encounter (HOSPITAL_BASED_OUTPATIENT_CLINIC_OR_DEPARTMENT_OTHER): Payer: Self-pay | Admitting: *Deleted

## 2018-08-23 ENCOUNTER — Inpatient Hospital Stay: Payer: Medicare HMO | Attending: Hematology & Oncology

## 2018-08-23 ENCOUNTER — Inpatient Hospital Stay (HOSPITAL_BASED_OUTPATIENT_CLINIC_OR_DEPARTMENT_OTHER)
Admission: EM | Admit: 2018-08-23 | Discharge: 2018-08-25 | DRG: 177 | Disposition: A | Discharge status: Home / Self Care | Payer: Medicare HMO | Attending: Internal Medicine | Admitting: Internal Medicine

## 2018-08-23 ENCOUNTER — Telehealth: Payer: Self-pay | Admitting: *Deleted

## 2018-08-23 DIAGNOSIS — I130 Hypertensive heart and chronic kidney disease with heart failure and stage 1 through stage 4 chronic kidney disease, or unspecified chronic kidney disease: Secondary | ICD-10-CM | POA: Diagnosis present

## 2018-08-23 DIAGNOSIS — Z79899 Other long term (current) drug therapy: Secondary | ICD-10-CM

## 2018-08-23 DIAGNOSIS — E861 Hypovolemia: Secondary | ICD-10-CM | POA: Diagnosis present

## 2018-08-23 DIAGNOSIS — Z888 Allergy status to other drugs, medicaments and biological substances status: Secondary | ICD-10-CM | POA: Diagnosis not present

## 2018-08-23 DIAGNOSIS — I4819 Other persistent atrial fibrillation: Secondary | ICD-10-CM | POA: Diagnosis present

## 2018-08-23 DIAGNOSIS — Z7901 Long term (current) use of anticoagulants: Secondary | ICD-10-CM | POA: Diagnosis not present

## 2018-08-23 DIAGNOSIS — E1122 Type 2 diabetes mellitus with diabetic chronic kidney disease: Secondary | ICD-10-CM | POA: Diagnosis present

## 2018-08-23 DIAGNOSIS — E785 Hyperlipidemia, unspecified: Secondary | ICD-10-CM | POA: Diagnosis present

## 2018-08-23 DIAGNOSIS — D500 Iron deficiency anemia secondary to blood loss (chronic): Secondary | ICD-10-CM

## 2018-08-23 DIAGNOSIS — Z885 Allergy status to narcotic agent status: Secondary | ICD-10-CM

## 2018-08-23 DIAGNOSIS — H9190 Unspecified hearing loss, unspecified ear: Secondary | ICD-10-CM | POA: Diagnosis present

## 2018-08-23 DIAGNOSIS — I10 Essential (primary) hypertension: Secondary | ICD-10-CM | POA: Diagnosis not present

## 2018-08-23 DIAGNOSIS — M1990 Unspecified osteoarthritis, unspecified site: Secondary | ICD-10-CM | POA: Diagnosis present

## 2018-08-23 DIAGNOSIS — E860 Dehydration: Secondary | ICD-10-CM | POA: Diagnosis present

## 2018-08-23 DIAGNOSIS — E876 Hypokalemia: Secondary | ICD-10-CM | POA: Diagnosis not present

## 2018-08-23 DIAGNOSIS — N179 Acute kidney failure, unspecified: Secondary | ICD-10-CM

## 2018-08-23 DIAGNOSIS — U071 COVID-19: Secondary | ICD-10-CM

## 2018-08-23 DIAGNOSIS — I5033 Acute on chronic diastolic (congestive) heart failure: Secondary | ICD-10-CM | POA: Diagnosis present

## 2018-08-23 DIAGNOSIS — E875 Hyperkalemia: Secondary | ICD-10-CM | POA: Diagnosis present

## 2018-08-23 DIAGNOSIS — Z886 Allergy status to analgesic agent status: Secondary | ICD-10-CM | POA: Diagnosis not present

## 2018-08-23 DIAGNOSIS — D649 Anemia, unspecified: Secondary | ICD-10-CM | POA: Diagnosis not present

## 2018-08-23 DIAGNOSIS — I959 Hypotension, unspecified: Secondary | ICD-10-CM | POA: Diagnosis present

## 2018-08-23 DIAGNOSIS — I4891 Unspecified atrial fibrillation: Secondary | ICD-10-CM | POA: Diagnosis present

## 2018-08-23 DIAGNOSIS — Z79890 Hormone replacement therapy (postmenopausal): Secondary | ICD-10-CM

## 2018-08-23 DIAGNOSIS — Z23 Encounter for immunization: Secondary | ICD-10-CM

## 2018-08-23 DIAGNOSIS — N183 Chronic kidney disease, stage 3 (moderate): Secondary | ICD-10-CM | POA: Diagnosis present

## 2018-08-23 DIAGNOSIS — E039 Hypothyroidism, unspecified: Secondary | ICD-10-CM | POA: Diagnosis present

## 2018-08-23 DIAGNOSIS — D509 Iron deficiency anemia, unspecified: Secondary | ICD-10-CM | POA: Diagnosis present

## 2018-08-23 DIAGNOSIS — K909 Intestinal malabsorption, unspecified: Secondary | ICD-10-CM

## 2018-08-23 LAB — CBC WITH DIFFERENTIAL (CANCER CENTER ONLY)
Abs Immature Granulocytes: 0.05 10*3/uL (ref 0.00–0.07)
Basophils Absolute: 0 10*3/uL (ref 0.0–0.1)
Basophils Relative: 1 %
Eosinophils Absolute: 0 10*3/uL (ref 0.0–0.5)
Eosinophils Relative: 0 %
HCT: 38.5 % (ref 36.0–46.0)
Hemoglobin: 12.2 g/dL (ref 12.0–15.0)
Immature Granulocytes: 1 %
Lymphocytes Relative: 12 %
Lymphs Abs: 0.7 10*3/uL (ref 0.7–4.0)
MCH: 30.2 pg (ref 26.0–34.0)
MCHC: 31.7 g/dL (ref 30.0–36.0)
MCV: 95.3 fL (ref 80.0–100.0)
Monocytes Absolute: 0.5 10*3/uL (ref 0.1–1.0)
Monocytes Relative: 8 %
Neutro Abs: 4.6 10*3/uL (ref 1.7–7.7)
Neutrophils Relative %: 78 %
Platelet Count: 184 10*3/uL (ref 150–400)
RBC: 4.04 MIL/uL (ref 3.87–5.11)
RDW: 12.7 % (ref 11.5–15.5)
WBC Count: 5.9 10*3/uL (ref 4.0–10.5)
nRBC: 0 % (ref 0.0–0.2)

## 2018-08-23 LAB — CBC
HCT: 34.6 % — ABNORMAL LOW (ref 36.0–46.0)
Hemoglobin: 11.1 g/dL — ABNORMAL LOW (ref 12.0–15.0)
MCH: 30.7 pg (ref 26.0–34.0)
MCHC: 32.1 g/dL (ref 30.0–36.0)
MCV: 95.6 fL (ref 80.0–100.0)
Platelets: 156 10*3/uL (ref 150–400)
RBC: 3.62 MIL/uL — ABNORMAL LOW (ref 3.87–5.11)
RDW: 12.8 % (ref 11.5–15.5)
WBC: 6.6 10*3/uL (ref 4.0–10.5)
nRBC: 0 % (ref 0.0–0.2)

## 2018-08-23 LAB — BASIC METABOLIC PANEL
Anion gap: 11 (ref 5–15)
BUN: 73 mg/dL — ABNORMAL HIGH (ref 8–23)
CO2: 14 mmol/L — ABNORMAL LOW (ref 22–32)
Calcium: 9.4 mg/dL (ref 8.9–10.3)
Chloride: 109 mmol/L (ref 98–111)
Creatinine, Ser: 2.16 mg/dL — ABNORMAL HIGH (ref 0.44–1.00)
GFR calc Af Amer: 23 mL/min — ABNORMAL LOW (ref >60)
GFR calc non Af Amer: 20 mL/min — ABNORMAL LOW (ref >60)
Glucose, Bld: 85 mg/dL (ref 70–99)
Potassium: 6.6 mmol/L (ref 3.5–5.1)
Sodium: 134 mmol/L — ABNORMAL LOW (ref 135–145)

## 2018-08-23 LAB — CMP (CANCER CENTER ONLY)
ALT: 6 U/L (ref 0–44)
AST: 12 U/L — ABNORMAL LOW (ref 15–41)
Albumin: 4 g/dL (ref 3.5–5.0)
Alkaline Phosphatase: 56 U/L (ref 38–126)
Anion gap: 11 (ref 5–15)
BUN: 77 mg/dL — ABNORMAL HIGH (ref 8–23)
CO2: 20 mmol/L — ABNORMAL LOW (ref 22–32)
Calcium: 10.2 mg/dL (ref 8.9–10.3)
Chloride: 105 mmol/L (ref 98–111)
Creatinine: 2.14 mg/dL — ABNORMAL HIGH (ref 0.44–1.00)
GFR, Est AFR Am: 23 mL/min — ABNORMAL LOW (ref >60)
GFR, Estimated: 20 mL/min — ABNORMAL LOW (ref >60)
Glucose, Bld: 92 mg/dL (ref 70–99)
Potassium: 7 mmol/L (ref 3.5–5.1)
Sodium: 136 mmol/L (ref 135–145)
Total Bilirubin: 0.7 mg/dL (ref 0.3–1.2)
Total Protein: 6.6 g/dL (ref 6.5–8.1)

## 2018-08-23 LAB — RETICULOCYTES
Immature Retic Fract: 16.5 % — ABNORMAL HIGH (ref 2.3–15.9)
RBC.: 4.09 MIL/uL (ref 3.87–5.11)
Retic Count, Absolute: 53.2 10*3/uL (ref 19.0–186.0)
Retic Ct Pct: 1.3 % (ref 0.4–3.1)

## 2018-08-23 LAB — SARS CORONAVIRUS 2 AG (30 MIN TAT): SARS Coronavirus 2 Ag: POSITIVE — AB

## 2018-08-23 LAB — D-DIMER, QUANTITATIVE: D-Dimer, Quant: 1.15 ug/mL-FEU — ABNORMAL HIGH (ref 0.00–0.50)

## 2018-08-23 LAB — MAGNESIUM: Magnesium: 2.1 mg/dL (ref 1.7–2.4)

## 2018-08-23 MED ORDER — ZINC SULFATE 220 (50 ZN) MG PO CAPS
220.0000 mg | ORAL_CAPSULE | Freq: Every day | ORAL | Status: DC
  Administered 2018-08-24 – 2018-08-25 (×2): 220 mg via ORAL
  Filled 2018-08-23 (×2): qty 1

## 2018-08-23 MED ORDER — SODIUM CHLORIDE 0.9 % IV BOLUS
1000.0000 mL | Freq: Once | INTRAVENOUS | Status: AC
  Administered 2018-08-23: 13:00:00 1000 mL via INTRAVENOUS

## 2018-08-23 MED ORDER — RIVAROXABAN 15 MG PO TABS
15.0000 mg | ORAL_TABLET | Freq: Every day | ORAL | Status: DC
  Filled 2018-08-23: qty 1

## 2018-08-23 MED ORDER — POLYSACCHARIDE IRON COMPLEX 150 MG PO CAPS: 150.0000 mg | ORAL_CAPSULE | Freq: Every day | ORAL | Status: DC

## 2018-08-23 MED ORDER — POLYSACCHARIDE IRON COMPLEX 150 MG PO CAPS
150.0000 mg | ORAL_CAPSULE | Freq: Every day | ORAL | Status: DC
  Filled 2018-08-23: qty 1

## 2018-08-23 MED ORDER — SODIUM ZIRCONIUM CYCLOSILICATE 10 G PO PACK
10.0000 g | PACK | Freq: Once | ORAL | Status: AC
  Administered 2018-08-23: 10 g via ORAL
  Filled 2018-08-23: qty 1

## 2018-08-23 MED ORDER — CARVEDILOL 12.5 MG PO TABS
12.5000 mg | ORAL_TABLET | Freq: Two times a day (BID) | ORAL | Status: DC
  Administered 2018-08-24 (×2): 12.5 mg via ORAL
  Filled 2018-08-23 (×2): qty 1

## 2018-08-23 MED ORDER — SODIUM BICARBONATE 8.4 % IV SOLN
INTRAVENOUS | Status: AC
  Filled 2018-08-23: qty 50

## 2018-08-23 MED ORDER — VITAMIN D3 25 MCG (1000 UNIT) PO TABS
1000.0000 [IU] | ORAL_TABLET | Freq: Every day | ORAL | Status: DC
  Filled 2018-08-23: qty 1

## 2018-08-23 MED ORDER — DOCUSATE SODIUM 100 MG PO CAPS
100.0000 mg | ORAL_CAPSULE | Freq: Two times a day (BID) | ORAL | Status: DC
  Administered 2018-08-24 – 2018-08-25 (×3): 100 mg via ORAL
  Filled 2018-08-23 (×2): qty 1

## 2018-08-23 MED ORDER — CALCIUM GLUCONATE-NACL 1-0.675 GM/50ML-% IV SOLN
INTRAVENOUS | Status: AC
  Administered 2018-08-23: 13:00:00 via INTRAVENOUS
  Filled 2018-08-23: qty 50

## 2018-08-23 MED ORDER — INSULIN REGULAR HUMAN 100 UNIT/ML IJ SOLN
10.0000 [IU] | Freq: Once | INTRAMUSCULAR | Status: AC
  Administered 2018-08-23: 15:00:00 10 [IU] via INTRAVENOUS
  Filled 2018-08-23: qty 1

## 2018-08-23 MED ORDER — HEPARIN SODIUM (PORCINE) 5000 UNIT/ML IJ SOLN: 5000.0000 [IU] | Freq: Three times a day (TID) | INTRAMUSCULAR | Status: DC

## 2018-08-23 MED ORDER — VITAMIN C 500 MG PO TABS
500.0000 mg | ORAL_TABLET | Freq: Every day | ORAL | Status: DC
  Administered 2018-08-24 – 2018-08-25 (×2): 500 mg via ORAL
  Filled 2018-08-23 (×2): qty 1

## 2018-08-23 MED ORDER — IPRATROPIUM-ALBUTEROL 20-100 MCG/ACT IN AERS
1.0000 | INHALATION_SPRAY | Freq: Four times a day (QID) | RESPIRATORY_TRACT | Status: DC
  Administered 2018-08-24 – 2018-08-25 (×5): 1 via RESPIRATORY_TRACT
  Filled 2018-08-23: qty 4

## 2018-08-23 MED ORDER — ESCITALOPRAM OXALATE 10 MG PO TABS
5.0000 mg | ORAL_TABLET | Freq: Every day | ORAL | Status: DC
  Administered 2018-08-24: 5 mg via ORAL
  Filled 2018-08-23 (×3): qty 0.5

## 2018-08-23 MED ORDER — SODIUM BICARBONATE 8.4 % IV SOLN
50.0000 meq | Freq: Once | INTRAVENOUS | Status: AC
  Administered 2018-08-24: 50 meq via INTRAVENOUS
  Filled 2018-08-23: qty 50

## 2018-08-23 MED ORDER — CALCIUM GLUCONATE 10 % IV SOLN
1.0000 g | Freq: Once | INTRAVENOUS | Status: AC
  Administered 2018-08-23: 1 g via INTRAVENOUS
  Filled 2018-08-23: qty 10

## 2018-08-23 MED ORDER — PNEUMOCOCCAL VAC POLYVALENT 25 MCG/0.5ML IJ INJ
0.5000 mL | INJECTION | INTRAMUSCULAR | Status: AC
  Administered 2018-08-24: 0.5 mL via INTRAMUSCULAR
  Filled 2018-08-23: qty 0.5

## 2018-08-23 MED ORDER — ONDANSETRON HCL 4 MG PO TABS
4.0000 mg | ORAL_TABLET | Freq: Four times a day (QID) | ORAL | Status: DC | PRN
  Administered 2018-08-25: 15:00:00 4 mg via ORAL
  Filled 2018-08-23: qty 1

## 2018-08-23 MED ORDER — ONDANSETRON HCL 4 MG/2ML IJ SOLN: 4.0000 mg | Freq: Four times a day (QID) | INTRAMUSCULAR | Status: DC | PRN

## 2018-08-23 MED ORDER — SODIUM CHLORIDE 0.9 % IV SOLN
INTRAVENOUS | Status: AC
  Administered 2018-08-24: 01:00:00 via INTRAVENOUS

## 2018-08-23 MED ORDER — VITAMIN D 25 MCG (1000 UNIT) PO TABS
1000.0000 [IU] | ORAL_TABLET | Freq: Every day | ORAL | Status: DC
  Administered 2018-08-24 – 2018-08-25 (×2): 1000 [IU] via ORAL
  Filled 2018-08-23 (×2): qty 1

## 2018-08-23 MED ORDER — LEVOTHYROXINE SODIUM 50 MCG PO TABS
50.0000 ug | ORAL_TABLET | Freq: Every day | ORAL | Status: DC
  Administered 2018-08-24 – 2018-08-25 (×2): 50 ug via ORAL
  Filled 2018-08-23 (×2): qty 1

## 2018-08-23 MED ORDER — SODIUM BICARBONATE 8.4 % IV SOLN
50.0000 meq | Freq: Once | INTRAVENOUS | Status: AC
  Administered 2018-08-23: 50 meq via INTRAVENOUS
  Filled 2018-08-23: qty 50

## 2018-08-23 MED ORDER — GUAIFENESIN-DM 100-10 MG/5ML PO SYRP: 10.0000 mL | ORAL_SOLUTION | ORAL | Status: DC | PRN

## 2018-08-23 MED ORDER — CALCIUM GLUCONATE-NACL 1-0.675 GM/50ML-% IV SOLN
1.0000 g | Freq: Once | INTRAVENOUS | Status: AC
  Administered 2018-08-23: 13:00:00 via INTRAVENOUS
  Filled 2018-08-23: qty 50

## 2018-08-23 MED ORDER — POLYSACCHARIDE IRON COMPLEX 150 MG PO CAPS
150.0000 mg | ORAL_CAPSULE | Freq: Every day | ORAL | Status: DC
  Administered 2018-08-24 – 2018-08-25 (×2): 150 mg via ORAL
  Filled 2018-08-23 (×3): qty 1

## 2018-08-23 MED ORDER — DEXTROSE 50 % IV SOLN
50.0000 mL | Freq: Once | INTRAVENOUS | Status: AC
  Administered 2018-08-23: 50 mL via INTRAVENOUS
  Filled 2018-08-23: qty 50

## 2018-08-23 MED ORDER — CALCIUM GLUCONATE 10 % IV SOLN: 1.0000 g | Freq: Once | INTRAVENOUS | Status: DC

## 2018-08-23 MED FILL — Insulin Regular (Human) Inj 100 Unit/ML: INTRAMUSCULAR | Qty: 0.1 | Status: AC

## 2018-08-23 NOTE — Progress Notes (Addendum)
85 year old female who presented with clinical signs of dehydration, her brother-in-law recently died and she has been not eating and drinking appropriately. Despite not feeling well she continue taking her potassium supplements.    Hemodynamically stable, blood pressure 135/85, heart rate 78, respiratory rate 18, oxygen saturation 100%  She was found in renal failure with hyperkalemia, serum potassium 6.6, serum creatinine 2.16,(baseline 1.59).  Her EKG had atrial fibrillation rhythm, 92 bpm, no peak T waves or QRS prolongation.   Patient received 1 L of normal saline, calcium gluconate, insulin/D50 and sodium zirconium.  Admitted to telemetry.  Inpatient.   Late entry 15:30: Patient tested positive for COVID 19, will admit patient to [place].

## 2018-08-23 NOTE — H&P (Signed)
History and Physical   Patient: Faith Holmes                            PCP: Blair Heys, PA-C                    DOB: [birth year]            DOA: 08/23/2018 TGP:[id number]             DOS: 08/23/2018, 11:05 PM  Patient coming from:   HOME (Xfer from [facility]) I have personally reviewed patient's medical records, in electronic medical records, including: [place] link, and care everywhere.    Chief Complaint:   Chief Complaint  Patient presents with  . Abnormal Lab    History of present illness:    Faith Holmes is a 85 y.o. female with medical history significant of HTN / HLD/ DM II (has not been on any oral medication) diet-controlled/ Afib/ dCHF/mild aortic stenosis/arthritis/Chronic iron deficiency ... Presented today at [facility] for, abnormal labs.  History was primarily provided and recorded clinical chronic medical records from daughter-in-law. Patient was evaluated at PCP, labs revealed potassium of 7.0.  Per family patient has had a poor p.o. intake since recent death of her brother-in-law.  Patient apparently been sleeping, not being interactive with family members, depressed mood.  Patient apparently takes medication, diuretics and some potassium supplements.  Despite poor p.o. intake family has been given her medications.    Patient Denies having: Fever, Chills, Cough, SOB, Chest Pain, Abd pain, N/V/D, headache, dizziness, lightheadedness, joint pain, rash, open wounds  ED Course:   Was seen at [facility]: Patient was noted to be withdrawn, but interactive following commands, detailed history was provided by daughter-in-law. Initial vitals blood pressure 88/54, briefly satting at 81% room air but drastically improved to 100% on room air.  Heart rate 71 and A. fib temp 98.2.  CMP reveals potassium of 7, BUN of 77, creatinine 2.14, LFTs within normal limits CBC within normal limits.  SARS-CoV-2 positive, markers were not ordered  Patient was accepted for acute renal failure,  hyperkalemia, dehydration, poor p.o. intake.  Patient was then transferred to [place]: As per HPI, otherwise 10 point review of systems were negative.   ----------------------------------------------------------------------------------------------------------------------  Allergies  Allergen Reactions  . Codeine Other (See Comments)  . Furosemide Other (See Comments)    Renal  Renal    . Oxaprozin Nausea Only  . Ramipril Cough    Home MEDs:  Prior to Admission medications   Medication Sig Start Date End Date Taking? Authorizing Provider  carvedilol (COREG) 12.5 MG tablet Take 12.5 mg by mouth 2 (two) times daily with a meal.   Yes [provider]  Cholecalciferol (VITAMIN D3) 1000 units CAPS Take 1,000 Units by mouth daily.   Yes [provider]  escitalopram (LEXAPRO) 5 MG tablet Take 5 mg by mouth at bedtime.   Yes [provider]  hydrochlorothiazide (HYDRODIURIL) 25 MG tablet Take 12.5 mg by mouth daily.    Yes [provider]  potassium chloride (MICRO-K) 10 MEQ CR capsule Take 10 mEq by mouth 2 (two) times daily. 07/21/17  Yes [provider]  Rivaroxaban (XARELTO) 15 MG TABS tablet Take 15 mg by mouth daily with supper.   Yes [provider]  cyanocobalamin 100 MCG tablet Take 100 mcg by mouth.    [provider]  diclofenac sodium (  VOLTAREN) 1 % GEL Place onto the skin. 03/20/17   [provider]  iron polysaccharides (NU-IRON) 150 MG capsule Take 150 mg by mouth.    [provider]  levothyroxine (SYNTHROID, LEVOTHROID) 50 MCG tablet Take 50 mcg by mouth daily.    [provider]    PRN MEDs: guaiFENesin-dextromethorphan, ondansetron **OR** ondansetron (ZOFRAN) IV  Past Medical History:  Diagnosis Date  . Arthritis   . Atrial fibrillation ([place])   . Diabetes mellitus ([place])   . Diastolic dysfunction   . 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 Gout   . Hard of  hearing   . High cholesterol   . Hypertension   . Iron deficiency anemia 07/01/2017  . 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 Mild aortic stenosis   . Thrombocytopenia ([place])   . Thyroid disease    hypothyroidism    Past Surgical History:  Procedure Laterality Date  . DOPPLER ECHOCARDIOGRAPHY     mild diastolic dysfuntion, trivial mitral and tricuspid valve regurgitation  . Mild-moderate AV stenosis       reports that she has never smoked. She has never used smokeless tobacco. She reports previous alcohol use. No history on file for drug.   History reviewed. No pertinent family history.  Physical Exam:   Vitals:   08/23/18 1600 08/23/18 1700 08/23/18 1800 08/23/18 1900  BP: 98/65 (!) 90/57 (!) 88/54 96/75  Pulse: 86 74 80 65  Resp: 19 20 17  (!) 23  Temp:      TempSrc:      SpO2: 98% 97% 95% 97%  Weight:      Height:       Constitutional: NAD, calm, comfortable Eyes: PERRL, lids and conjunctivae normal ENMT: Mucous membranes are moist. Posterior pharynx clear of any exudate or lesions.Normal dentition.  Neck: normal, supple, no masses, no thyromegaly Respiratory: clear to auscultation bilaterally, no wheezing, no crackles. Normal respiratory effort. No accessory muscle use.  Cardiovascular: Regular rate and rhythm, no murmurs / rubs / gallops. No extremity edema. 2+ pedal pulses. No carotid bruits.  Abdomen: no tenderness, no masses palpated. No hepatosplenomegaly. Bowel sounds positive.  Musculoskeletal: no clubbing / cyanosis. No joint deformity upper and lower extremities. Good ROM, no contractures. Normal muscle tone.  Neurologic: CN II-XII grossly intact. Sensation intact, DTR normal. Strength 5/5 in all 4.  Psychiatric: Normal judgment and insight. Alert and oriented x 3. Normal mood.  Skin: no rashes, lesions, ulcers. No induration Decubitus/ulcers: none  Urinary catheter:   Labs on admission:    I have personally reviewed following labs and imaging studies  CBC:  Recent Labs  Lab 08/23/18 1053  WBC 5.9  NEUTROABS 4.6  HGB 12.2  HCT 38.5  MCV 95.3  PLT 440   Basic Metabolic Panel: Recent Labs  Lab 08/23/18 1053 08/23/18 1313  NA 136 134*  K 7.0* 6.6*  CL 105 109  CO2 20* 14*  GLUCOSE 92 85  BUN 77* 73*  CREATININE 2.14* 2.16*  CALCIUM 10.2 9.4  MG  --  2.1   GFR: Estimated Creatinine Clearance: 12.4 mL/min (A) (by C-G formula based on SCr of 2.16 mg/dL (H)). Liver Function Tests: Recent Labs  Lab 08/23/18 1053  AST 12*  ALT 6  ALKPHOS 56  BILITOT 0.7  PROT 6.6  ALBUMIN 4.0  CoagulatioAnemia Panel: Recent Labs    08/23/18 1053  RETICCTPCT 1.3   Urine analysis:    Component Value Date/Time   COLORURINE YELLOW 07/02/2009 [place]  07/02/2009 2236   LABSPEC 1.007 07/02/2009 2236   PHURINE 5.0 07/02/2009 2236   GLUCOSEU NEGATIVE 07/02/2009 2236   HGBUR NEGATIVE 07/02/2009 2236   BILIRUBINUR NEGATIVE 07/02/2009 2236   KETONESUR NEGATIVE 07/02/2009 2236   PROTEINUR NEGATIVE 07/02/2009 2236   UROBILINOGEN 0.2 07/02/2009 2236   NITRITE NEGATIVE 07/02/2009 2236   LEUKOCYTESUR TRACE (A) 07/02/2009 2236     Radiologic Exams on Admission:   No results found.  EKG:   Independently reviewed.   Orders placed or performed during the hospital encounter of 08/23/18  . ED EKG  . ED EKG  . EKG 12-Lead  . EKG 12-Lead   --------------------------------------------------------------------------------------------------------------------------    Assessment / Plan:   Principal Problem:   Hyperkalemia Active Problems:   Iron deficiency anemia   ARF (acute renal failure) (HCC)   COVID-19 virus infection   Hypotension   Dehydration   HTN (hypertension)   A-fib (HCC)   HLD (hyperlipidemia)   Hypothyroidism   Acute on chronic diastolic CHF (congestive heart failure) (HCC)    Principal Problem:   ARF (acute renal failure) (HCC) -Multifactorial likely due to dehydration, poor p.o. intake  -BUN/cr.  77/2.14 (Baseline creatinine 1.59) -Was initially treated with bicarbonate [facility] -We will continue gentle IV fluid hydration, repeating stat labs -Avoiding nephrotoxins  Hyperkalemia -Potassium was 7 >>> 6.6 -Patient apparently is on calcium supplements, will be DC'd. -Diuretic will be held -Patient was treated at [facility] with calcium gluconate, IV glucose with insulin Potassium improved from 7.0-> to 6.6 -We will check CMP stat, anticipating another amp of calcium gluconate     COVID-19 virus infection -Patient was SARS-CoV-2 positive -Temp 98.7, -Currently on room air satting 99% -Currently on room air, stable minimal respiratory distress -Markers C-reactive protein, ferritin, LDH, procalcitonin, IL-6, will be ordered -We will monitor closely -Patient in not a  steroids, and Ramdesivir    Dehydration -patient has received 1 L of normal saline, calcium gluconate insulin/D50 at [facility] -We will continue gentle IV fluid hydration with normal saline -Monitoring p.o. intake -Monitor respiratory status as patient is covered positive with history of dCHF     Hypotension - BP was as low as 88/54, currently 111/84 -Likely due to dehydration, poor p.o. intake, -Holding blood pressure medication including HCTZ -Continue gentle IV fluid hydration, limited due to history of dCHF  Active Problems:   Iron deficiency anemia  -Chronic, iron deficiency with erythropoietin -H&H stable, monitoring, continue iron supplements   H/o  HTN (hypertension) -Patient remains mildly hypotensive,  -holding home medication including HCTZ    A-fib (HCC) -Currently rate controlled with beta-blocker, continue Coreg, -Currently chronically anticoagulated with Xarelto, dose will be adjusted to renal state    HLD (hyperlipidemia) -Continue statins    Hypothyroidism -We will check labs, TSH -Continue current home dose of Synthroid    Acute on chronic diastolic CHF (congestive heart failure)  (HCC)  -could not locate last Echo -We will continue check daily weight, labs proBNP (may not be accurate due to AKI) -Patient looks dehydrated at this time, will continue with gentle IV fluid hydration, -We will be mindful and precautions to avoid volume overload. -Patient will need an echocardiogram once stable  History of mild aortic stenosis -Once stable 2D echo needed -Currently asymptomatic we will monitor closely  H/o DM II -Currently on not any diabetic medications -May not need further medication -Poor p.o. intake -We will check blood sugar QA / CHS -A1c   DVT prophylaxis: SCD/Compression stockings and IE:PPIRJJO  Code Status:   Code Status: Full Code  Family Communication:  The above findings and plan of care has been discussed with patient and family in detail, they expressed understanding and agreement of above plan.   Disposition Plan: >3 days  Consults called:  None  Admission status: Patient will be admitted as Inpatient, with a greater than 2 midnight length of stay.   Cultures:  -none  Antimicrobial: -none  ---------------------------------------------------------------------------------------------------------------------------------------------------------------------------------------------------------------------------------------  Time spent: > than  23  Min.   SIGNED: Deatra James, MD, FACP, FHM. [facility],  Pager [phone]  If 7PM-7AM, please contact night-coverage [email or web address] 08/23/2018, 11:05 PM

## 2018-08-23 NOTE — ED Provider Notes (Signed)
[facility] EMERGENCY DEPARTMENT Provider Note   CSN: [id number] Arrival date & time: 08/23/18  1231     History   Chief Complaint Chief Complaint  Patient presents with  . Abnormal Lab    HPI Faith Holmes is a 85 y.o. female.     HPI   89yF with hyperkalemia. History primarily from daughter-in-law at bedside and also review of records. Pt had labs today with potassium of 7.0 and referred to ED. Pt apparently has been eating and drinking very little since the recent death of her brother-in-law. She says she sleeps all the time. Daughter-in-law thinks that pt is depressed. She is on potassium supplementation.   Past Medical History:  Diagnosis Date  . Arthritis   . Atrial fibrillation ([place])   . Diabetes mellitus ([place])   . Diastolic dysfunction   . 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 Gout   . Hard of hearing   . High cholesterol   . Hypertension   . Iron deficiency anemia 07/01/2017  . 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 Mild aortic stenosis   . Thrombocytopenia ([place])   . Thyroid disease    hypothyroidism    Patient Active Problem List   Diagnosis Date Noted  . Iron deficiency anemia 07/01/2017  . 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 DOPPLER ECHOCARDIOGRAPHY     mild diastolic dysfuntion, trivial mitral and tricuspid valve regurgitation  . Mild-moderate AV stenosis       OB History   No obstetric history on file.      Home Medications    Prior to Admission medications   Medication Sig Start Date End Date Taking? Authorizing Provider  carvedilol (COREG) 12.5 MG tablet Take 12.5 mg by mouth 2 (two) times daily with a meal.    [provider]  Cholecalciferol (VITAMIN D3) 1000 units CAPS Take 1,000 Units by mouth daily.    [provider]  cyanocobalamin 100 MCG tablet Take 100 mcg by mouth.    [provider]   diclofenac sodium (VOLTAREN) 1 % GEL Place onto the skin. 03/20/17   [provider]  hydrochlorothiazide (HYDRODIURIL) 25 MG tablet Take 25 mg by mouth daily.    [provider]  iron polysaccharides (NU-IRON) 150 MG capsule Take 150 mg by mouth.    [provider]  levothyroxine (SYNTHROID, LEVOTHROID) 50 MCG tablet Take 50 mcg by mouth daily.    [provider]  potassium chloride (MICRO-K) 10 MEQ CR capsule Take 10 mEq by mouth 2 (two) times daily. 07/21/17   [provider]  Rivaroxaban (XARELTO) 15 MG TABS tablet Take 15 mg by mouth daily with supper.    [provider]    Family History No family history on file.  Social History Social History   Tobacco Use  . Smoking status: Never Smoker  . Smokeless tobacco: Never Used  Substance Use Topics  . Alcohol use: Not Currently  . Drug use: Not on file     Allergies   Codeine, Furosemide, Oxaprozin, and Ramipril   Review of Systems Review of Systems  All systems reviewed and negative, other than as noted in HPI.  Physical Exam Updated Vital Signs BP 135/85 (BP Location: Right Arm)   Pulse 78   Temp 98.7 F (37.1 C)   Resp 18   Ht 5' (1.524 m)   Wt 47.6 kg   SpO2 (!) 81%  BMI 20.51 kg/m   Physical Exam Vitals signs and nursing note reviewed.  Constitutional:      General: She is not in acute distress.    Appearance: She is well-developed.     Comments: Hard of hearing  HENT:     Head: Normocephalic and atraumatic.  Eyes:     General:        Right eye: No discharge.        Left eye: No discharge.     Conjunctiva/sclera: Conjunctivae normal.  Neck:     Musculoskeletal: Neck supple.  Cardiovascular:     Rate and Rhythm: Normal rate and regular rhythm.     Heart sounds: Normal heart sounds. No murmur. No friction rub. No gallop.   Pulmonary:     Effort: Pulmonary effort is normal. No respiratory distress.     Breath sounds: Normal breath sounds.   Abdominal:     General: There is no distension.     Palpations: Abdomen is soft.     Tenderness: There is no abdominal tenderness.  Musculoskeletal:        General: No tenderness.  Skin:    General: Skin is warm and dry.  Neurological:     Mental Status: She is alert.  Psychiatric:        Behavior: Behavior normal.        Thought Content: Thought content normal.      ED Treatments / Results  Labs (all labs ordered are listed, but only abnormal results are displayed) Labs Reviewed  BASIC METABOLIC PANEL - Abnormal; Notable for the following components:      Result Value   Sodium 134 (*)    Potassium 6.6 (*)    CO2 14 (*)    BUN 73 (*)    Creatinine, Ser 2.16 (*)    GFR calc non Af Amer 20 (*)    GFR calc Af Amer 23 (*)    All other components within normal limits  SARS CORONAVIRUS 2 (HOSP ORDER, PERFORMED IN [place] [facility] VIA ABBOTT ID)  MAGNESIUM    EKG None  Radiology No results found.  Procedures Procedures (including critical care time)  CRITICAL CARE Performed by: Virgel Manifold Total critical care time: 35 minutes Critical care time was exclusive of separately billable procedures and treating other patients. Critical care was necessary to treat or prevent imminent or life-threatening deterioration. Critical care was time spent personally by me on the following activities: development of treatment plan with patient and/or surrogate as well as nursing, discussions with consultants, evaluation of patient's response to treatment, examination of patient, obtaining history from patient or surrogate, ordering and performing treatments and interventions, ordering and review of laboratory studies, ordering and review of radiographic studies, pulse oximetry and re-evaluation of patient's condition.   Medications Ordered in ED Medications  calcium gluconate in NaCl 1-0.675 GM/50ML-% IVPB (has no administration in time range)  sodium bicarbonate 1 mEq/mL injection  (has no administration in time range)  sodium zirconium cyclosilicate (LOKELMA) packet 10 g (has no administration in time range)  calcium gluconate inj 10% (1 g) URGENT USE ONLY! (1 g Intravenous Given 08/23/18 1352)  sodium bicarbonate injection 50 mEq (50 mEq Intravenous Given 08/23/18 1355)  sodium chloride 0.9 % bolus 1,000 mL (1,000 mLs Intravenous New Bag/Given 08/23/18 1316)     Initial Impression / Assessment and Plan / ED Course  I have reviewed the triage vital signs and the nursing notes.  Pertinent labs & imaging results that  were available during my care of the patient were reviewed by me and considered in my medical decision making (see chart for details).       89yF with hyperkalemia.  Likely from worsening renal function in setting of poor PO intake and potassium supplementation. HD stable. Afib is chronic. IVF. Ca, bicarb. Lokelma. Will admit for ongoing hydration/electrolyte management.   Final Clinical Impressions(s) / ED Diagnoses   Final diagnoses:  Hyperkalemia  Dehydration    ED Discharge Orders    None       Virgel Manifold, MD 08/23/18 1413

## 2018-08-23 NOTE — ED Triage Notes (Signed)
Pt seen in Dr. Haydee Monica office today and had labs drawn.  K  7.0

## 2018-08-23 NOTE — Progress Notes (Signed)
Hematology and Oncology Follow Up Visit  TYLIE GOLONKA [id number] 27-Dec-1926 85 y.o. 08/23/2018   Principle Diagnosis:  Iron deficiency anemia Anemia secondary to erythropoietin deficiency  Current Therapy:   IV iron as indicated    Interim History:  Ms. Kobus is here today for her annual follow-up with her daughter in law. She is having some fatigue. This is felt to be due some to depression as her brother in law passed away last week. She has since started Lexapro and hopefully that will help.  She has not been eating well this week and needs to drink more fluids daily. Her weight is down a total of 18 lbs since we saw her last in May 2019.  No fever, chills, n/v, cough, rash, dizziness, SOB, chest pain, palpitations, abdominal pain or changes in bowel or bladder habits.  No swelling, tenderness, numbness or tingling in her extremities.  No lymphadenopathy noted on exam.  No episodes of bleeding, no bruising or petechiae.   ECOG Performance Status: 1 - Symptomatic but completely ambulatory  Medications:  Allergies as of 08/23/2018      Reactions   Codeine Other (See Comments)   Furosemide Other (See Comments)   Renal  Renal    Oxaprozin Nausea Only   Ramipril Cough      Medication List       Accurate as of August 23, 2018 11:06 AM. If you have any questions, ask your nurse or doctor.        carvedilol 12.5 MG tablet Commonly known as: COREG Take 12.5 mg by mouth 2 (two) times daily with a meal.   cyanocobalamin 100 MCG tablet Take 100 mcg by mouth.   hydrochlorothiazide 25 MG tablet Commonly known as: HYDRODIURIL Take 25 mg by mouth daily.   levothyroxine 50 MCG tablet Commonly known as: SYNTHROID Take 50 mcg by mouth daily.   Micro-K 10 MEQ CR capsule Generic drug: potassium chloride Take 10 mEq by mouth 2 (two) times daily.   Nu-Iron 150 MG capsule Generic drug: iron polysaccharides Take 150 mg by mouth.   Rivaroxaban 15 MG Tabs tablet Commonly  known as: XARELTO Take 15 mg by mouth daily with supper.   Vitamin D3 25 MCG (1000 UT) Caps Take 1,000 Units by mouth daily.   Voltaren 1 % Gel Generic drug: diclofenac sodium Place onto the skin.       Allergies:  Allergies  Allergen Reactions  . Codeine Other (See Comments)  . Furosemide Other (See Comments)    Renal  Renal    . Oxaprozin Nausea Only  . Ramipril Cough    Past Medical History, Surgical history, Social history, and Family History were reviewed and updated.  Review of Systems: All other 10 point review of systems is negative.   Physical Exam:  vitals were not taken for this visit.   Wt Readings from Last 3 Encounters:  07/30/17 123 lb (55.8 kg)  05/27/17 127 lb (57.6 kg)  04/15/17 127 lb (57.6 kg)    Ocular: Sclerae unicteric, pupils equal, round and reactive to light Ear-nose-throat: Oropharynx clear, dentition fair Lymphatic: No cervical or supraclavicular adenopathy Lungs no rales or rhonchi, good excursion bilaterally Heart regular rate and rhythm, no murmur appreciated Abd soft, nontender, positive bowel sounds, no liver or spleen tip palpated on exam, no fluid wave  MSK no focal spinal tenderness, no joint edema Neuro: non-focal, well-oriented, appropriate affect Breasts: Deferred   Lab Results  Component Value Date   WBC 4.4 07/30/2017  HGB 11.5 (L) 07/30/2017   HCT 35.5 07/30/2017   MCV 97.5 07/30/2017   PLT 101 (L) 07/30/2017   Lab Results  Component Value Date   FERRITIN 344 (H) 07/30/2017   IRON 59 07/30/2017   TIBC 216 (L) 07/30/2017   UIBC 157 07/30/2017   IRONPCTSAT 27 07/30/2017   Lab Results  Component Value Date   RETICCTPCT 1.2 07/30/2017   RBC 3.65 (L) 07/30/2017   RBC 3.64 (L) 07/30/2017   Lab Results  Component Value Date   KPAFRELGTCHN 52.0 (H) 05/27/2017   LAMBDASER 35.4 (H) 05/27/2017   KAPLAMBRATIO 1.47 05/27/2017   Lab Results  Component Value Date   IGGSERUM 963 05/27/2017   IGA 187 05/27/2017    IGMSERUM 53 05/27/2017   Lab Results  Component Value Date   TOTALPROTELP 6.0 05/27/2017   ALBUMINELP 3.6 05/27/2017   A1GS 0.2 05/27/2017   A2GS 0.8 05/27/2017   BETS 0.7 05/27/2017   BETA2SER 4.9 03/12/2009   GAMS 0.8 05/27/2017   MSPIKE Not Observed 05/27/2017   SPEI  03/12/2009    (NOTE) Nonspecific pattern. Reviewed by Odis Hollingshead, MD, PhD, FCAP (Electronic Signature on File)     Chemistry      Component Value Date/Time   NA 139 07/30/2017 0949   K 5.1 07/30/2017 0949   CL 104 07/30/2017 0949   CO2 27 07/30/2017 0949   BUN 22 07/30/2017 0949   CREATININE 1.59 (H) 07/30/2017 0949      Component Value Date/Time   CALCIUM 9.4 07/30/2017 0949   ALKPHOS 63 07/30/2017 0949   AST 17 07/30/2017 0949   ALT 7 07/30/2017 0949   BILITOT 0.5 07/30/2017 0949       Impression and Plan: Ms. Deakins is a pleasant 85 yo caucasian female with multifactorial anemia. Hgb was stable and iron studies are pending.  After she left our office her CMP came back showing acute renal failure and hypokalemia. We called and instructed her daughter in law to take her immediately to the ED for further work up and treatment which she did.  Patient is currently admitted to telemetry and has tested positive for Covid 19.Nurse manager is aware   Laverna Peace, NP 6/22/202011:06 AM

## 2018-08-23 NOTE — Telephone Encounter (Signed)
Jory Ee NP notified of K-7.0 and repeat of K-6.8.  Order received to call patient's daughter-in law, Darlene and have her take patient to the ED for EKG and repeat potassium now.  Carlyon Shadow states that she will take patient to the ED now.

## 2018-08-23 NOTE — ED Notes (Signed)
Attempted report to [employer] x 3 at [phone]. No answer. Supervisor notified.

## 2018-08-24 ENCOUNTER — Telehealth: Payer: Self-pay | Admitting: Family

## 2018-08-24 DIAGNOSIS — U071 COVID-19: Principal | ICD-10-CM

## 2018-08-24 DIAGNOSIS — I4891 Unspecified atrial fibrillation: Secondary | ICD-10-CM

## 2018-08-24 DIAGNOSIS — E860 Dehydration: Secondary | ICD-10-CM

## 2018-08-24 LAB — COMPREHENSIVE METABOLIC PANEL
ALT: 9 U/L (ref 0–44)
ALT: 8 U/L (ref 0–44)
AST: 15 U/L (ref 15–41)
AST: 13 U/L — ABNORMAL LOW (ref 15–41)
Albumin: 3.5 g/dL (ref 3.5–5.0)
Albumin: 3.1 g/dL — ABNORMAL LOW (ref 3.5–5.0)
Alkaline Phosphatase: 52 U/L (ref 38–126)
Alkaline Phosphatase: 43 U/L (ref 38–126)
Anion gap: 10 (ref 5–15)
Anion gap: 10 (ref 5–15)
BUN: 65 mg/dL — ABNORMAL HIGH (ref 8–23)
BUN: 61 mg/dL — ABNORMAL HIGH (ref 8–23)
CO2: 20 mmol/L — ABNORMAL LOW (ref 22–32)
CO2: 23 mmol/L (ref 22–32)
Calcium: 9.2 mg/dL (ref 8.9–10.3)
Calcium: 8.9 mg/dL (ref 8.9–10.3)
Chloride: 108 mmol/L (ref 98–111)
Chloride: 107 mmol/L (ref 98–111)
Creatinine, Ser: 1.9 mg/dL — ABNORMAL HIGH (ref 0.44–1.00)
Creatinine, Ser: 1.82 mg/dL — ABNORMAL HIGH (ref 0.44–1.00)
GFR calc Af Amer: 26 mL/min — ABNORMAL LOW (ref >60)
GFR calc Af Amer: 28 mL/min — ABNORMAL LOW (ref >60)
GFR calc non Af Amer: 23 mL/min — ABNORMAL LOW (ref >60)
GFR calc non Af Amer: 24 mL/min — ABNORMAL LOW (ref >60)
Glucose, Bld: 75 mg/dL (ref 70–99)
Glucose, Bld: 68 mg/dL — ABNORMAL LOW (ref 70–99)
Potassium: 5.3 mmol/L — ABNORMAL HIGH (ref 3.5–5.1)
Potassium: 4.6 mmol/L (ref 3.5–5.1)
Sodium: 138 mmol/L (ref 135–145)
Sodium: 140 mmol/L (ref 135–145)
Total Bilirubin: 0.8 mg/dL (ref 0.3–1.2)
Total Bilirubin: 0.8 mg/dL (ref 0.3–1.2)
Total Protein: 6.3 g/dL — ABNORMAL LOW (ref 6.5–8.1)
Total Protein: 5.6 g/dL — ABNORMAL LOW (ref 6.5–8.1)

## 2018-08-24 LAB — TRIGLYCERIDES: Triglycerides: 99 mg/dL (ref <150)

## 2018-08-24 LAB — GLUCOSE, CAPILLARY
Glucose-Capillary: 101 mg/dL — ABNORMAL HIGH (ref 70–99)
Glucose-Capillary: 115 mg/dL — ABNORMAL HIGH (ref 70–99)
Glucose-Capillary: 69 mg/dL — ABNORMAL LOW (ref 70–99)
Glucose-Capillary: 69 mg/dL — ABNORMAL LOW (ref 70–99)
Glucose-Capillary: 82 mg/dL (ref 70–99)
Glucose-Capillary: 84 mg/dL (ref 70–99)
Glucose-Capillary: 91 mg/dL (ref 70–99)

## 2018-08-24 LAB — CBC WITH DIFFERENTIAL/PLATELET
Abs Immature Granulocytes: 0.04 10*3/uL (ref 0.00–0.07)
Basophils Absolute: 0 10*3/uL (ref 0.0–0.1)
Basophils Relative: 0 %
Eosinophils Absolute: 0 10*3/uL (ref 0.0–0.5)
Eosinophils Relative: 1 %
HCT: 31 % — ABNORMAL LOW (ref 36.0–46.0)
Hemoglobin: 9.8 g/dL — ABNORMAL LOW (ref 12.0–15.0)
Immature Granulocytes: 1 %
Lymphocytes Relative: 12 %
Lymphs Abs: 0.6 10*3/uL — ABNORMAL LOW (ref 0.7–4.0)
MCH: 30.2 pg (ref 26.0–34.0)
MCHC: 31.6 g/dL (ref 30.0–36.0)
MCV: 95.4 fL (ref 80.0–100.0)
Monocytes Absolute: 0.4 10*3/uL (ref 0.1–1.0)
Monocytes Relative: 9 %
Neutro Abs: 3.9 10*3/uL (ref 1.7–7.7)
Neutrophils Relative %: 77 %
Platelets: 125 10*3/uL — ABNORMAL LOW (ref 150–400)
RBC: 3.25 MIL/uL — ABNORMAL LOW (ref 3.87–5.11)
RDW: 12.6 % (ref 11.5–15.5)
WBC: 5.1 10*3/uL (ref 4.0–10.5)
nRBC: 0 % (ref 0.0–0.2)

## 2018-08-24 LAB — PHOSPHORUS: Phosphorus: 3.2 mg/dL (ref 2.5–4.6)

## 2018-08-24 LAB — LACTATE DEHYDROGENASE
LDH: 132 U/L (ref 98–192)
LDH: 145 U/L (ref 98–192)

## 2018-08-24 LAB — ABO/RH: ABO/RH(D): A POS

## 2018-08-24 LAB — IRON AND TIBC
Iron: 66 ug/dL (ref 41–142)
Saturation Ratios: 29 % (ref 21–57)
TIBC: 229 ug/dL — ABNORMAL LOW (ref 236–444)
UIBC: 163 ug/dL (ref 120–384)

## 2018-08-24 LAB — MAGNESIUM: Magnesium: 1.8 mg/dL (ref 1.7–2.4)

## 2018-08-24 LAB — D-DIMER, QUANTITATIVE: D-Dimer, Quant: 1.12 ug/mL-FEU — ABNORMAL HIGH (ref 0.00–0.50)

## 2018-08-24 LAB — C-REACTIVE PROTEIN
CRP: <0.8 mg/dL (ref <1.0)
CRP: <0.8 mg/dL (ref <1.0)

## 2018-08-24 LAB — FERRITIN
Ferritin: 574 ng/mL — ABNORMAL HIGH (ref 11–307)
Ferritin: 449 ng/mL — ABNORMAL HIGH (ref 11–307)
Ferritin: 412 ng/mL — ABNORMAL HIGH (ref 11–307)

## 2018-08-24 LAB — HEMOGLOBIN A1C
Hgb A1c MFr Bld: 5.4 % (ref 4.8–5.6)
Mean Plasma Glucose: 108.28 mg/dL

## 2018-08-24 LAB — TSH: TSH: 0.576 u[IU]/mL (ref 0.350–4.500)

## 2018-08-24 LAB — CK: Total CK: 27 U/L — ABNORMAL LOW (ref 38–234)

## 2018-08-24 MED ORDER — APIXABAN 2.5 MG PO TABS
2.5000 mg | ORAL_TABLET | Freq: Two times a day (BID) | ORAL | Status: DC
  Administered 2018-08-24 – 2018-08-25 (×3): 2.5 mg via ORAL
  Filled 2018-08-24 (×7): qty 1

## 2018-08-24 MED ORDER — SODIUM CHLORIDE 0.9 % IV SOLN
INTRAVENOUS | Status: AC
  Administered 2018-08-24 – 2018-08-25 (×2): via INTRAVENOUS

## 2018-08-24 MED ORDER — INSULIN ASPART 100 UNIT/ML ~~LOC~~ SOLN: 0.0000 [IU] | Freq: Every day | SUBCUTANEOUS | Status: DC

## 2018-08-24 MED ORDER — INSULIN ASPART 100 UNIT/ML ~~LOC~~ SOLN: 0.0000 [IU] | Freq: Three times a day (TID) | SUBCUTANEOUS | Status: DC

## 2018-08-24 NOTE — Evaluation (Signed)
Clinical/Bedside Swallow Evaluation Patient Details  Name: Faith Holmes MRN: [id number] Date of Birth: 1926/09/07  Today's Date: 08/24/2018 Time: SLP Start Time (ACUTE ONLY): 42 SLP Stop Time (ACUTE ONLY): 1607 SLP Time Calculation (min) (ACUTE ONLY): 17 min  Past Medical History:  Past Medical History:  Diagnosis Date  . Arthritis   . Atrial fibrillation ([place])   . Diabetes mellitus ([place])   . Diastolic dysfunction   . 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 Gout   . Hard of hearing   . High cholesterol   . Hypertension   . Iron deficiency anemia 07/01/2017  . 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 Mild aortic stenosis   . Thrombocytopenia ([place])   . Thyroid disease    hypothyroidism   Past Surgical History:  Past Surgical History:  Procedure Laterality Date  . DOPPLER ECHOCARDIOGRAPHY     mild diastolic dysfuntion, trivial mitral and tricuspid valve regurgitation  . Mild-moderate AV stenosis     HPI:  Faith Holmes is a 85 y.o. female with medical history significant of HTN / HLD/ DM II (has not been on any oral medication) diet-controlled/ Afib/ dCHF/mild aortic stenosis/arthritis/Chronic iron deficiency. Presented to [facility] for abnormal labs (K+ 7.0) Patient was accepted for acute renal failure, hyperkalemia, dehydration, poor p.o. intake. +COVID 19   Assessment / Plan / Recommendation Clinical Impression  Pt has very loosely-fitting dentures, reporting that she typically uses an adhesive but that she does not have that with her here. She is also very hard of hearing, and communicated more easily when SLP typed out information to her. Pt self-fed a variety of consistencies, including dry saltines, without overt signs of aspiration and with swallow function appearing grossly functional despite her dentition. She utilized extra time, liquid washes, and lingual sweeps with Mod I. We discussed the importance of proper mastication so as to not pose a choking risk. She said that she  takes very small bites (which was already observed) and if something it too difficult to chew, she does not try to eat it. Although I think that her awareness is appropriate to continue to do this, I did offer a softer diet for ease of intake while she is in the hospital. Pt politely declined this at this time. No further acute needs are identified at this time. Pt can feel free to downgrade to softer solids per her preference. Please reorder if any acute concerns arise.   SLP Visit Diagnosis: Dysphagia, unspecified (R13.10)    Aspiration Risk  Mild aspiration risk    Diet Recommendation Regular;Thin liquid   Liquid Administration via: Cup;Straw Medication Administration: Whole meds with liquid Supervision: Patient able to self feed;Intermittent supervision to cue for compensatory strategies Compensations: Slow rate;Small sips/bites Postural Changes: Seated upright at 90 degrees    Other  Recommendations Oral Care Recommendations: Oral care BID   Follow up Recommendations None      Frequency and Duration            Prognosis        Swallow Study   General HPI: Faith Holmes is a 85 y.o. female with medical history significant of HTN / HLD/ DM II (has not been on any oral medication) diet-controlled/ Afib/ dCHF/mild aortic stenosis/arthritis/Chronic iron deficiency. Presented to [facility] for abnormal labs (K+ 7.0) Patient was accepted for acute renal failure, hyperkalemia, dehydration, poor p.o. intake. +COVID 19 Type of Study: Bedside Swallow Evaluation Previous Swallow Assessment: none in chart Diet Prior to this Study: Regular;Thin liquids Temperature  Spikes Noted: No Respiratory Status: Room air History of Recent Intubation: No Behavior/Cognition: Alert;Cooperative;Pleasant mood Oral Care Completed by SLP: No Oral Cavity - Dentition: Dentures, top;Dentures, bottom(loosely-fitting) Vision: Functional for self-feeding Self-Feeding Abilities: Able to feed self Patient  Positioning: Upright in bed Baseline Vocal Quality: Normal    Oral/Motor/Sensory Function Overall Oral Motor/Sensory Function: (appears functional)   Ice Chips Ice chips: Not tested   Thin Liquid Thin Liquid: Within functional limits Presentation: Self Fed;Straw    Nectar Thick Nectar Thick Liquid: Not tested   Honey Thick Honey Thick Liquid: Not tested   Puree Puree: Within functional limits Presentation: Self Fed;Spoon   Solid     Solid: Within functional limits Presentation: Dunn Loring 08/24/2018,4:31 PM  Pollyann Glen, M.A. [place] Acute Environmental education officer [phone] Office [phone]

## 2018-08-24 NOTE — Progress Notes (Signed)
Pt resting comfortably, no s/s of distress. NT reported CBG retake 91, VSS.

## 2018-08-24 NOTE — Telephone Encounter (Signed)
No los 6/22

## 2018-08-24 NOTE — Evaluation (Signed)
Physical Therapy Evaluation Patient Details Name: Faith Holmes MRN: [id number] DOB: 1926/09/02 Today's Date: 08/24/2018   History of Present Illness  Faith Holmes is a 85 y.o. female with medical history significant of HTN / HLD/ DM II (has not been on any oral medication) diet-controlled/ Afib/ dCHF/mild aortic stenosis/arthritis/Chronic iron deficiency. Presented to [facility] for abnormal labs (K+ 7.0) Patient was accepted for acute renal failure, hyperkalemia, dehydration, poor p.o. intake. +COVID 19  Clinical Impression  Pt admitted with above diagnosis. Pt currently with functional limitations due to the deficits listed below (see PT Problem List). Patient with great difficulty hearing this therapist and unable to read PTs lips due to masking. She mobilized with RW to [facility] and then walked in her room (with multiple lines/monitor) with SaO2>92% throughout. Typically holds onto furniture per daughter. Pt will benefit from skilled PT to increase their independence and safety with mobility to allow discharge to the venue listed below.       Follow Up Recommendations No PT follow up;Supervision - Intermittent    Equipment Recommendations  None recommended by PT    Recommendations for Other Services OT consult     Precautions / Restrictions Precautions Precautions: Fall      Mobility  Bed Mobility Overal bed mobility: Modified Independent             General bed mobility comments: incr time  Transfers Overall transfer level: Modified independent Equipment used: Rolling walker (2 wheeled)             General transfer comment: sit to stand and stand-pivot to [facility]   Ambulation/Gait Ambulation/Gait assistance: Supervision Gait Distance (Feet): 35 Feet Assistive device: Rolling walker (2 wheeled) Gait Pattern/deviations: Step-through pattern;Decreased stride length     General Gait Details: limited distance due to multiple lines/monitor  Stairs             Wheelchair Mobility    Modified Rankin (Stroke Patients Only)       Balance Overall balance assessment: (no imbalance noted; dtr denies h/o falls)                                           Pertinent Vitals/Pain Pain Assessment: No/denies pain    Home Living Family/patient expects to be discharged to:: Private residence Living Arrangements: Alone Available Help at Discharge: Family;Available PRN/intermittently(daughter lives next door) Type of Home: House Home Access: Stairs to enter   Technical brewer of Steps: 2 Home Layout: One level Home Equipment: Walker - 2 wheels Additional Comments: pt unable to hear without her hearing aids; information from her daughter via phone    Prior Function Level of Independence: Needs assistance   Gait / Transfers Assistance Needed: no device inside; holds onto arm of daughter if goes outside home  ADL's / Homemaking Assistance Needed: does not drive; family assists with groceries  Comments: unsure-pt reports she usually walks in her home with no device; RN reported she walks with RW at home ??     Hand Dominance        Extremity/Trunk Assessment   Upper Extremity Assessment Upper Extremity Assessment: Generalized weakness    Lower Extremity Assessment Lower Extremity Assessment: Generalized weakness    Cervical / Trunk Assessment Cervical / Trunk Assessment: Kyphotic  Communication   Communication: HOH(per RN, hearing aids left at [facility])  Cognition Arousal/Alertness: Awake/alert Behavior During Therapy: WFL for tasks  assessed/performed Overall Cognitive Status: Difficult to assess                                        General Comments General comments (skin integrity, edema, etc.): pt initially reaching to hold onto PT to ambulate, but did well with RW; daughter confirmed she usually holds onto furniture or someone     Exercises     Assessment/Plan    PT Assessment  Patient needs continued PT services  PT Problem List Decreased strength;Decreased balance;Decreased mobility;Decreased knowledge of use of DME;Decreased safety awareness       PT Treatment Interventions DME instruction;Gait training;Functional mobility training;Therapeutic activities;Therapeutic exercise;Balance training;Patient/family education    PT Goals (Current goals can be found in the Care Plan section)  Acute Rehab PT Goals Patient Stated Goal: return home PT Goal Formulation: With patient/family Time For Goal Achievement: 09/07/18 Potential to Achieve Goals: Good    Frequency Min 3X/week   Barriers to discharge        Co-evaluation               AM-PAC PT "6 Clicks" Mobility  Outcome Measure Help needed turning from your back to your side while in a flat bed without using bedrails?: None Help needed moving from lying on your back to sitting on the side of a flat bed without using bedrails?: None Help needed moving to and from a bed to a chair (including a wheelchair)?: A Little Help needed standing up from a chair using your arms (e.g., wheelchair or bedside chair)?: A Little Help needed to walk in hospital room?: A Little Help needed climbing 3-5 steps with a railing? : A Little 6 Click Score: 20    End of Session   Activity Tolerance: Patient tolerated treatment well Patient left: in bed;with call bell/phone within reach   PT Visit Diagnosis: Difficulty in walking, not elsewhere classified (R26.2)    Time: 2122-4825 PT Time Calculation (min) (ACUTE ONLY): 17 min   Charges:   PT Evaluation $PT Eval Low Complexity: 1 Low            [employer], PT 08/24/2018, 12:28 PM

## 2018-08-24 NOTE — Progress Notes (Signed)
PROGRESS NOTE                                                                                                                                                                                                             Patient Demographics:    Faith Holmes, is a 85 y.o. female, DOB - [birth year], OIT:[id number]  Admit date - 08/23/2018   Admitting Physician Mauricio Gerome Apley, MD  Outpatient Primary MD for the patient is Long, Genevie Cheshire  LOS - 1   Chief Complaint  Patient presents with  . Abnormal Lab       Brief Narrative    85 y.o. female with medical history significant of HTN / HLD/ DM II (has not been on any oral medication) diet-controlled/ Afib/ dCHF/mild aortic stenosis/arthritis/Chronic iron deficiency . Presented today at [facility] for, abnormal labs. Patient was evaluated at PCP, labs revealed potassium of 7.0.    Creatinine was elevated from baseline Per family patient has had a poor p.o. intake since recent death of her brother-in-law.  Patient apparently been sleeping, not being interactive with family members, depressed mood.  Patient apparently takes medication, diuretics and some potassium supplements.    Subjective:    Faith Holmes today denies any complaints as discussed with staff, she had poor appetite and oral intake this morning.   Assessment  & Plan :    Principal Problem:   Hyperkalemia Active Problems:   Iron deficiency anemia   ARF (acute renal failure) (HCC)   COVID-19 virus infection   Hypotension   Dehydration   HTN (hypertension)   A-fib (HCC)   HLD (hyperlipidemia)   Hypothyroidism   Acute on chronic diastolic CHF (congestive heart failure) (HCC)   AKI on CKD III -Multifactorial likely due to dehydration, poor p.o. intake -BUN/cr.  77/2.14 (Baseline creatinine 1.59) -Was initially treated with bicarbonate HPMC -Peers to be dehydrated and volume depleted, continue with IV fluids -Avoiding nephrotoxins   Hyperkalemia -Potassium was 7  on presentation, this is most likely due to worsening renal function and she kept taking her potassium supplements. -Did receive IV glucose with insulin, calcium gluconate in ED, this has all resolved, it is 4.6 today   COVID-19 virus infection -Patient was SARS-CoV-2 positive -He is afebrile, with no hypoxia, RP within normal limit, mildly elevated ferritin and D-dimers, no indication for treatment  currently.  No indication for steroids or Remdesivir.  Dehydration -Continue with IV fluids, will monitor closely for signs of volume overload given her history of diastolic CHF.   Hypotension - BP was as low as 88/54 on presentation, improved with IV fluids -Likely due to dehydration, poor p.o. intake, -Holding blood pressure medication including HCTZ -Continue gentle IV fluid hydration, limited due to history of dCHF   Iron deficiency anemia  -Chronic, iron deficiency with erythropoietin -H&H stable, monitoring, continue iron supplements   H/o  HTN (hypertension) -blood Pressure remains soft -holding home medication including HCTZ   A-fib ([place]) -Currently rate controlled with beta-blocker, continue Coreg, -Currently chronically anticoagulated with Xarelto, has been transitioned to Eliquis given her during renal function.   HLD (hyperlipidemia) -Continue statins   Hypothyroidism -TSH within normal limit -Continue current home dose of Synthroid  chronic diastolic CHF (congestive heart failure) (HCC) -Apppears to be hypovolemic/dry, monitor closely as on IV fluids  History of mild aortic stenosis -Once stable 2D echo needed -Currently asymptomatic we will monitor closely  H/o DM II -Currently on not any diabetic medications -May not need further medication -Poor p.o. intake -We will check blood sugar QA / CHS -A1c is 5.4    COVID-19 Labs  Recent Labs    08/23/18 1053 08/23/18 2255 08/24/18 0453  DDIMER  --  1.15*  1.12*  FERRITIN 574* 449* 412*  LDH  --   --  132  145  CRP  --  <0.8 <0.8    Lab Results  Component Value Date   SARSCOV2NAA POSITIVE (A) 08/23/2018     Code Status : Full  Family Communication  : D/W daughter in law   Disposition Plan  : Will be able to go home tomorrow if medically stable, Ms. Faith Holmes will be able to visit her frequently(live next to each other)  Barriers For Discharge : remains on IV fluids ,remains with poor appetitie.  Consults  :  None  Procedures  : None  DVT Prophylaxis  :  Xarelto>>Eliquis  Lab Results  Component Value Date   PLT 125 (L) 08/24/2018    Antibiotics  :    Anti-infectives (From admission, onward)   None        Objective:   Vitals:   08/23/18 2245 08/24/18 0504 08/24/18 0818 08/24/18 1334  BP: 111/84 (!) 122/53 123/82 (!) 109/47  Pulse: 87 77    Resp: 17 14  20   Temp: 98.7 F (37.1 C) 98 F (36.7 C) 98.6 F (37 C) 97.6 F (36.4 C)  TempSrc: Oral Oral Oral Oral  SpO2: 99% 97%    Weight:      Height:        Wt Readings from Last 3 Encounters:  08/23/18 47.6 kg  07/30/17 55.8 kg  05/27/17 57.6 kg     Intake/Output Summary (Last 24 hours) at 08/24/2018 1501 Last data filed at 08/24/2018 0600 Gross per 24 hour  Intake 462.19 ml  Output 425 ml  Net 37.19 ml     Physical Exam  Awake Alert, extremly hard of hearing(Currently patient does not have her hearing aids) Dry oral mucosa Symmetrical Chest wall movement, Good air movement bilaterally, CTAB RRR,No Gallops,Rubs or new Murmurs, No Parasternal Heave +ve B.Sounds, Abd Soft, No tenderness, No rebound - guarding or rigidity. No Cyanosis, Clubbing or edema, delayed skin turgor    Data Review:    CBC Recent Labs  Lab 08/23/18 1053 08/23/18 2255 08/24/18 0453  WBC 5.9 6.6  5.1  HGB 12.2 11.1* 9.8*  HCT 38.5 34.6* 31.0*  PLT 184 156 125*  MCV 95.3 95.6 95.4  MCH 30.2 30.7 30.2  MCHC 31.7 32.1 31.6  RDW 12.7 12.8 12.6  LYMPHSABS 0.7  --   0.6*  MONOABS 0.5  --  0.4  EOSABS 0.0  --  0.0  BASOSABS 0.0  --  0.0    Chemistries  Recent Labs  Lab 08/23/18 1053 08/23/18 1313 08/23/18 2255 08/24/18 0453  NA 136 134* 138 140  K 7.0* 6.6* 5.3* 4.6  CL 105 109 108 107  CO2 20* 14* 20* 23  GLUCOSE 92 85 75 68*  BUN 77* 73* 65* 61*  CREATININE 2.14* 2.16* 1.90* 1.82*  CALCIUM 10.2 9.4 9.2 8.9  MG  --  2.1  --  1.8  AST 12*  --  15 13*  ALT 6  --  9 8  ALKPHOS 56  --  52 43  BILITOT 0.7  --  0.8 0.8   ------------------------------------------------------------------------------------------------------------------ Recent Labs    08/24/18 0453  TRIG 99    Lab Results  Component Value Date   HGBA1C 5.4 08/24/2018   ------------------------------------------------------------------------------------------------------------------ Recent Labs    08/23/18 2255  TSH 0.576   ------------------------------------------------------------------------------------------------------------------ Recent Labs    08/23/18 1053 08/23/18 2255 08/24/18 0453  FERRITIN 574* 449* 412*  TIBC 229*  --   --   IRON 66  --   --   RETICCTPCT 1.3  --   --     Coagulation profile No results for input(s): INR, PROTIME in the last 168 hours.  Recent Labs    08/23/18 2255 08/24/18 0453  DDIMER 1.15* 1.12*    Cardiac Enzymes No results for input(s): CKMB, TROPONINI, MYOGLOBIN in the last 168 hours.  Invalid input(s): CK ------------------------------------------------------------------------------------------------------------------ No results found for: BNP  Inpatient Medications  Scheduled Meds: . apixaban  2.5 mg Oral BID  . carvedilol  12.5 mg Oral BID WC  . cholecalciferol  1,000 Units Oral Daily  . docusate sodium  100 mg Oral BID  . escitalopram  5 mg Oral QHS  . insulin aspart  0-5 Units Subcutaneous QHS  . insulin aspart  0-9 Units Subcutaneous TID WC  . Ipratropium-Albuterol  1 puff Inhalation Q6H  . iron  polysaccharides  150 mg Oral Daily  . levothyroxine  50 mcg Oral Q0600  . vitamin C  500 mg Oral Daily  . zinc sulfate  220 mg Oral Daily   Continuous Infusions: PRN Meds:.guaiFENesin-dextromethorphan, ondansetron **OR** ondansetron (ZOFRAN) IV  Micro Results Recent Results (from the past 240 hour(s))  SARS Coronavirus 2 (Hosp order,Performed in [facility] lab via [employer] ID)     Status: Abnormal   Collection Time: 08/23/18  2:08 PM   Specimen: Dry Nasal Swab ([employer] ID Now)  Result Value Ref Range Status   SARS Coronavirus 2 ([employer] ID Now) POSITIVE (A) NEGATIVE Final    Comment: RESULT CALLED TO, READ BACK BY AND VERIFIED WITH: AMY HARTLEY RN @1431  08/23/2018 OLSONM (NOTE) Interpretive Result Comment(s): COVID 19 Positive SARS CoV 2 target nucleic acids are DETECTED. The SARS CoV 2 RNA is generally detectable in upper and lower respiratory specimens during the acute phase of infection.  Positive results are indicative of active infection with SARS CoV 2.  Clinical correlation with patient history and other diagnostic information is necessary to determine patient infection status.  Positive results do not rule out bacterial infection or coinfection with other viruses. The expected result  is Negative. COVID 19 Negative SARS CoV 2 target nucleic acids are NOT DETECTED. The SARS CoV 2 RNA is generally detectable in upper and lower respiratory specimens during the acute phase of infection.  Negative results do not preclude SARS CoV 2 infection, do not rule out coinfections with other pathogens, and should not be used as the sole basis for treatme nt or other patient management decisions.  Negative results must be combined with clinical observations, patient history, and epidemiological information. The expected result is Negative. Invalid Presence or absence of SARS CoV 2 nucleic acids cannot be determined. Repeat testing was performed on the submitted specimen and repeated  Invalid results were obtained.  If clinically indicated, additional testing on a new specimen with an alternate test methodology [phone]) is advised.  The SARS CoV 2 RNA is generally detectable in upper and lower respiratory specimens during the acute phase of infection. The expected result is Negative. Fact Sheet for Patients:  GolfingFamily.no Fact Sheet for Healthcare Providers: https://www.hernandez-brewer.com/ This test is not yet approved or cleared by the [place] FDA and has been authorized for detection and/or diagnosis of SARS CoV 2 by FDA under an Emergency Use Authorization (EUA).  This EU A will remain in effect (meaning this test can be used) for the duration of the COVID19 declaration under Section 564(b)(1) of the Act, 21 U.S.C. section 306 551 7311 3(b)(1), unless the authorization is terminated or revoked sooner. Performed at [facility], [address], [place], [place] [address]     Radiology Reports No results found.  Time Spent in minutes  25 minutes   Phillips Climes M.D on 08/24/2018 at 3:01 PM  Between 7am to 7pm - Pager - [phone]  After 7pm go to [email or web address] - password [facility]  [facility] -  Office  [phone]

## 2018-08-24 NOTE — Progress Notes (Signed)
Pt has been on Xarelto 15mg  qday for her afib. She is 85yo and her wt is <50kg. She has a hx of CRI and her CrCl is relatively low. Currently it's <15 ml/min. Xarelto is not recommended in pts with poor CrCl. D/w Dr. Waldron Labs and we will change it to apixaban instead. Age>80, wt<60, scr>1.5. We will use low dose apixaban 2.5mg  PO BID.  Onnie Boer, PharmD, BCIDP, AAHIVP, CPP Infectious Disease Pharmacist 08/24/2018 11:14 AM

## 2018-08-25 DIAGNOSIS — N179 Acute kidney failure, unspecified: Secondary | ICD-10-CM

## 2018-08-25 LAB — COMPREHENSIVE METABOLIC PANEL
ALT: 8 U/L (ref 0–44)
AST: 14 U/L — ABNORMAL LOW (ref 15–41)
Albumin: 2.7 g/dL — ABNORMAL LOW (ref 3.5–5.0)
Alkaline Phosphatase: 42 U/L (ref 38–126)
Anion gap: 7 (ref 5–15)
BUN: 51 mg/dL — ABNORMAL HIGH (ref 8–23)
CO2: 22 mmol/L (ref 22–32)
Calcium: 8.2 mg/dL — ABNORMAL LOW (ref 8.9–10.3)
Chloride: 108 mmol/L (ref 98–111)
Creatinine, Ser: 1.47 mg/dL — ABNORMAL HIGH (ref 0.44–1.00)
GFR calc Af Amer: 36 mL/min — ABNORMAL LOW (ref >60)
GFR calc non Af Amer: 31 mL/min — ABNORMAL LOW (ref >60)
Glucose, Bld: 76 mg/dL (ref 70–99)
Potassium: 3.9 mmol/L (ref 3.5–5.1)
Sodium: 137 mmol/L (ref 135–145)
Total Bilirubin: 0.6 mg/dL (ref 0.3–1.2)
Total Protein: 5 g/dL — ABNORMAL LOW (ref 6.5–8.1)

## 2018-08-25 LAB — CK: Total CK: 36 U/L — ABNORMAL LOW (ref 38–234)

## 2018-08-25 LAB — TRIGLYCERIDES: Triglycerides: 88 mg/dL (ref <150)

## 2018-08-25 LAB — CBC WITH DIFFERENTIAL/PLATELET
Abs Immature Granulocytes: 0.03 10*3/uL (ref 0.00–0.07)
Basophils Absolute: 0 10*3/uL (ref 0.0–0.1)
Basophils Relative: 0 %
Eosinophils Absolute: 0.1 10*3/uL (ref 0.0–0.5)
Eosinophils Relative: 2 %
HCT: 29.6 % — ABNORMAL LOW (ref 36.0–46.0)
Hemoglobin: 9.3 g/dL — ABNORMAL LOW (ref 12.0–15.0)
Immature Granulocytes: 1 %
Lymphocytes Relative: 17 %
Lymphs Abs: 0.9 10*3/uL (ref 0.7–4.0)
MCH: 30.2 pg (ref 26.0–34.0)
MCHC: 31.4 g/dL (ref 30.0–36.0)
MCV: 96.1 fL (ref 80.0–100.0)
Monocytes Absolute: 0.5 10*3/uL (ref 0.1–1.0)
Monocytes Relative: 9 %
Neutro Abs: 3.9 10*3/uL (ref 1.7–7.7)
Neutrophils Relative %: 71 %
Platelets: 100 10*3/uL — ABNORMAL LOW (ref 150–400)
RBC: 3.08 MIL/uL — ABNORMAL LOW (ref 3.87–5.11)
RDW: 12.8 % (ref 11.5–15.5)
WBC: 5.4 10*3/uL (ref 4.0–10.5)
nRBC: 0 % (ref 0.0–0.2)

## 2018-08-25 LAB — GLUCOSE, CAPILLARY
Glucose-Capillary: 98 mg/dL (ref 70–99)
Glucose-Capillary: 94 mg/dL (ref 70–99)

## 2018-08-25 LAB — C-REACTIVE PROTEIN: CRP: <0.8 mg/dL (ref <1.0)

## 2018-08-25 LAB — D-DIMER, QUANTITATIVE: D-Dimer, Quant: 1.1 ug/mL-FEU — ABNORMAL HIGH (ref 0.00–0.50)

## 2018-08-25 LAB — FERRITIN: Ferritin: 383 ng/mL — ABNORMAL HIGH (ref 11–307)

## 2018-08-25 LAB — MAGNESIUM: Magnesium: 1.7 mg/dL (ref 1.7–2.4)

## 2018-08-25 LAB — LACTATE DEHYDROGENASE: LDH: 137 U/L (ref 98–192)

## 2018-08-25 LAB — PHOSPHORUS: Phosphorus: 2.8 mg/dL (ref 2.5–4.6)

## 2018-08-25 LAB — INTERLEUKIN-6, PLASMA: Interleukin-6, Plasma: 14.4 pg/mL — ABNORMAL HIGH (ref 0.0–12.2)

## 2018-08-25 NOTE — Progress Notes (Signed)
Reviewed AVS with pt's daughter Carlyon Shadow over the phone.

## 2018-08-25 NOTE — Evaluation (Addendum)
Occupational Therapy Evaluation Patient Details Name: Faith Holmes MRN: [id number] DOB: 1927/02/08 Today's Date: 08/25/2018    History of Present Illness 85 y.o. female with medical history significant of HTN, HLD, DM II (has not been on any oral medication) diet-controlled, Afib, dCHF, mild aortic stenosis, arthritis, and Chronic iron deficiency. Presented to [facility] for abnormal labs (K+ 7.0). Patient was accepted for acute renal failure, hyperkalemia, dehydration, poor p.o. intake. Tested +COVID 19.   Clinical Impression   PTA, pt was living alone and was performing BADLs and did not use DME for functional mobility; per chart review. Pt currently requiring Min A for LB ADLs and for functional mobility with single hand held A. Pt presenting with decreased balance and safety. SpO2 >90% on RA throughout and no signs of SOB. When asking pt about home information, she was unable to recall if she lived alone or if she had a daughter. Pt reporting she has a daughter-in-law who checks in on her. Pt also oriented to being in the hospital because she didn't feel well and then tested positive for COVID while at the hospital. Pt would benefit from further acute OT to facilitate safe dc. Pending home support, recommend dc to home with [name] for further OT to optimize safety, independence with ADLs, and return to PLOF.      Follow Up Recommendations  Home health OT;Supervision/Assistance - 24 hour    Equipment Recommendations  Tub/shower seat;Other (comment)(Confirm with family on DME)    Recommendations for Other Services PT consult     Precautions / Restrictions Precautions Precautions: Fall Restrictions Weight Bearing Restrictions: No      Mobility Bed Mobility Overal bed mobility: Modified Independent             General bed mobility comments: incr time  Transfers Overall transfer level: Needs assistance Equipment used: 1 person hand held assist Transfers: Sit to/from Stand Sit to  Stand: Min assist         General transfer comment: Min A for single hand held A and to stabilize once in standing.     Balance Overall balance assessment: Needs assistance(no imbalance noted; dtr denies h/o falls) Sitting-balance support: No upper extremity supported;Feet supported Sitting balance-Leahy Scale: Good     Standing balance support: No upper extremity supported;During functional activity Standing balance-Leahy Scale: Poor Standing balance comment: Reliant on atleast one UE for support. Would benefit from RW                           ADL either performed or assessed with clinical judgement   ADL Overall ADL's : Needs assistance/impaired Eating/Feeding: Set up;Supervision/ safety;Sitting   Grooming: Wash/dry hands;Wash/dry face;Min guard;Standing;Brushing hair   Upper Body Bathing: Set up;Sitting;Supervision/ safety   Lower Body Bathing: Minimal assistance;Sit to/from stand Lower Body Bathing Details (indicate cue type and reason): Min A for standing balance without UE support Upper Body Dressing : Set up;Supervision/safety;Sitting   Lower Body Dressing: Minimal assistance;Sit to/from stand   Toilet Transfer: Minimal assistance;Ambulation;Regular Toilet;Grab bars;Min guard Toilet Transfer Details (indicate cue type and reason): Min A for balance during mobility to toilet. Pt able to hold grab bars and perform transfer with Min Guard A Toileting- Clothing Manipulation and Hygiene: Set up;Supervision/safety;Sitting/lateral lean       Functional mobility during ADLs: Minimal assistance(single hand held A) General ADL Comments: Pt presenting with decreased balance and safety. Requiring Min A for ADLs in standing. Pt reaching out for objects to  hold throughout session.      Vision         Perception     Praxis      Pertinent Vitals/Pain Pain Assessment: No/denies pain     Hand Dominance     Extremity/Trunk Assessment Upper Extremity  Assessment Upper Extremity Assessment: Generalized weakness   Lower Extremity Assessment Lower Extremity Assessment: Generalized weakness   Cervical / Trunk Assessment Cervical / Trunk Assessment: Kyphotic   Communication Communication Communication: HOH(per RN, hearing aids left at [facility])   Cognition Arousal/Alertness: Awake/alert Behavior During Therapy: WFL for tasks assessed/performed Overall Cognitive Status: Difficult to assess Area of Impairment: Problem solving;Memory;Orientation                 Orientation Level: (Able to state she is at the hospital because she is sick)   Memory: Decreased short-term memory       Problem Solving: Slow processing General Comments: Difficult to fully assess cognition due to [facility]. When asking if she lives alone, pt stating "I don't know. Oh dear, that is a silly thing not to know." Then asking about her daughter and she stated "I don't know if I have a daughter. Boy, youre making me look bad." Pt then reporting she has a daughter-in-law who checks on her who was married to her osn who passed away. Demonstrating problem solving during ADLs such as stating she won't be able to eat since her teeth require adhesive. Pt also oriented to being in the hospital and that she came because she was sick (then found out she had COIVD-19).    General Comments  SpO2 >90% on RA throughout    Exercises     Shoulder Instructions      Home Living Family/patient expects to be discharged to:: Private residence Living Arrangements: Alone Available Help at Discharge: Family;Available PRN/intermittently(daughter lives next door) Type of Home: House Home Access: Stairs to enter Technical brewer of Steps: 2   Home Layout: One level               Home Equipment: Environmental consultant - 2 wheels   Additional Comments: pt unable to hear without her hearing aids; information from her daughter via phone      Prior Functioning/Environment Level of  Independence: Needs assistance  Gait / Transfers Assistance Needed: no device inside; holds onto arm of daughter if goes outside home ADL's / Homemaking Assistance Needed: does not drive; family assists with groceries Communication / Swallowing Assistance Needed: HOH; hearing aids not here Comments: Unsure of reliability. When asking about home, pt stating "I am not thinking well this morning. Do I live alone? No one lives with me." Pt confirming that her Daughter-in-law looks in on her and assists with IADLs        OT Problem List: Decreased strength;Decreased range of motion;Decreased activity tolerance;Impaired balance (sitting and/or standing);Decreased knowledge of use of DME or AE;Decreased knowledge of precautions      OT Treatment/Interventions: Self-care/ADL training;Therapeutic exercise;DME and/or AE instruction;Energy conservation;Therapeutic activities;Patient/family education    OT Goals(Current goals can be found in the care plan section) Acute Rehab OT Goals Patient Stated Goal: return home OT Goal Formulation: With patient Time For Goal Achievement: 09/08/18 Potential to Achieve Goals: Good  OT Frequency: Min 3X/week   Barriers to D/C:            Co-evaluation              AM-PAC OT "6 Clicks" Daily Activity     Outcome  Measure Help from another person eating meals?: None Help from another person taking care of personal grooming?: A Little Help from another person toileting, which includes using toliet, bedpan, or urinal?: A Little Help from another person bathing (including washing, rinsing, drying)?: A Little Help from another person to put on and taking off regular upper body clothing?: None Help from another person to put on and taking off regular lower body clothing?: A Little 6 Click Score: 20   End of Session Nurse Communication: Mobility status  Activity Tolerance: Patient tolerated treatment well Patient left: in chair;with call bell/phone within  reach;with chair alarm set;with nursing/sitter in room  OT Visit Diagnosis: Unsteadiness on feet (R26.81);Other abnormalities of gait and mobility (R26.89);Muscle weakness (generalized) (M62.81)                Time: 8682-5749 OT Time Calculation (min): 36 min Charges:  OT General Charges $OT Visit: 1 Visit OT Evaluation $OT Eval Moderate Complexity: 1 Mod OT Treatments $Self Care/Home Management : 8-22 mins  Leahmarie Gasiorowski MSOT, OTR/L Acute Rehab Pager: [phone] Office: [place] 08/25/2018, 9:49 AM

## 2018-08-25 NOTE — Progress Notes (Signed)
Called daughter to see if she could bring denture paste for patient from home. Daughter unable to at this time. Family states patient had hearing aids. On [facility] admission documentation, no hearing aids documented only socks and upper/lower denture. This Probation officer called the emergency department [facility]. [phone]. Charge nurse Amy states no hearing aides documented only upper/lower dentures. Call family and inform [place] charge nurse.

## 2018-08-25 NOTE — Discharge Instructions (Signed)
 Dehydration  Dehydration is when there is not enough fluid or water in your body. This happens when you lose more fluids than you take in. People who are age 83 or older have a higher risk of getting dehydrated. Dehydration can range from mild to very bad. It should be treated right away to keep it from getting very bad. Symptoms of mild dehydration may include:  Thirst.  Dry lips.  Slightly dry mouth.  Dry, warm skin.  Dizziness. Symptoms of moderate dehydration may include:  Very dry mouth.  Muscle cramps.  Dark pee (urine). Pee may be the color of tea.  Your body making less pee.  Your eyes making fewer tears.  Heartbeat that is uneven or faster than normal (palpitations).  Headache.  Light-headedness, especially when you stand up from sitting.  Fainting (syncope). Symptoms of very bad dehydration may include:  Changes in skin, such as: ? Cold and clammy skin. ? Blotchy (mottled) or pale skin. ? Skin that does not quickly return to normal after being lightly pinched and let go (poor skin turgor).  Changes in body fluids, such as: ? Feeling very thirsty. ? Your eyes making fewer tears. ? Not sweating when body temperature is high, such as in hot weather. ? Your body making very little pee.  Changes in vital signs, such as: ? Weak pulse. ? Pulse that is more than 100 beats a minute when you are sitting still. ? Fast breathing. ? Low blood pressure.  Other changes, such as: ? Sunken eyes. ? Cold hands and feet. ? Confusion. ? Lack of energy (lethargy). ? Trouble waking up from sleep. ? Short-term weight loss. ? Unconsciousness. Follow these instructions at home:   If told by your doctor, drink an ORS: ? Make an ORS by using instructions on the package. ? Start by drinking small amounts, about  cup (120 mL) every 5-10 minutes. ? Slowly drink more until you have had the amount that your doctor said to have.  Drink enough clear fluid to keep your  pee clear or pale yellow. If you were told to drink an ORS, finish the ORS first, then start slowly drinking clear fluids. Drink fluids such as: ? Water. Do not drink only water by itself. Doing that can make the salt (sodium) level in your body get too low (hyponatremia). ? Ice chips. ? Fruit juice that you have added water to (diluted). ? Low-calorie sports drinks.  Avoid: ? Alcohol. ? Drinks that have a lot of sugar. These include high-calorie sports drinks, fruit juice that does not have water added, and soda. ? Caffeine. ? Foods that are greasy or have a lot of fat or sugar.  Take over-the-counter and prescription medicines only as told by your doctor.  Do not take salt tablets. Doing that can make the salt level in your body get too high (hypernatremia).  Eat foods that have minerals (electrolytes). Examples include bananas, oranges, potatoes, tomatoes, and spinach.  Keep all follow-up visits as told by your doctor. This is important. Contact a doctor if:  You have belly (abdominal) pain that: ? Gets worse. ? Stays in one area (localizes).  You have a rash.  You have a stiff neck.  You get angry or annoyed more easily than normal (irritability).  You are more sleepy than normal.  You have a harder time waking up than normal.  You feel: ? Weak. ? Dizzy. ? Very thirsty. Get help right away if:  You have symptoms of   very bad dehydration.  You cannot drink fluids without throwing up (vomiting).  Your symptoms get worse with treatment.  You have a fever.  You have a very bad headache.  You are throwing up or having watery poop (diarrhea) and it: ? Gets worse. ? Does not go away.  You have diarrhea for more than 24 hours.  You have blood or something green (bile) in your throw-up.  You have blood in your poop (stool). This may cause poop to look black and tarry.  You have not peed in 6-8 hours.  You have peed (urinated) only a small amount of very dark  pee during 6-8 hours.  You pass out (faint).  Your heart rate when you are sitting still is more than 100 beats a minute.  You have trouble breathing. This information is not intended to replace advice given to you by your health care provider. Make sure you discuss any questions you have with your health care provider. Document Released: 02/06/2011 Document Revised: 09/07/2015 Document Reviewed: 04/13/2015 Elsevier Interactive Patient Education  2019 [employer]  

## 2018-08-25 NOTE — Progress Notes (Signed)
Spoke with pt's daughter Carlyon Shadow concerning [place] needs. Darlene, states that she did not think pt would need HHPT/OT and they would not come out until two weeks. This CM checked with four [place] agencies in that area, neither would take [facility] [employer]. Encouraged Darlene to observe pt, also referred back to pt's PCP MD if [facility] is needed.

## 2018-08-25 NOTE — Discharge Summary (Signed)
[facility]  Physician Discharge Summary   Patient ID: Faith Holmes MRN: [id number] DOB/AGE: 05-13-1926 85 y.o.  Admit date: 08/23/2018 Discharge date: 08/25/2018  PCP: Blair Heys, PA-C  DISCHARGE DIAGNOSES:  Acute renal failure due to hypovolemia, resolved Hypokalemia, resolved COVID-19 infection, stable Iron deficiency anemia History of essential hypertension Atrial fibrillation Hypothyroidism Chronic diastolic CHF History of mild aortic stenosis History of diabetes mellitus type 2   RECOMMENDATIONS FOR OUTPATIENT FOLLOW UP: 1. Follow-up with PCP in 1 week 2. Her hydrochlorothiazide has been placed on hold.    Home Health: Home health PT and OT Equipment/Devices: Shower bench  CODE STATUS: Full code  DISCHARGE CONDITION: fair  Diet recommendation: As before  INITIAL HISTORY: 85 y.o.femalewith medical history significant ofHTN / HLD/ DM II (has not been on any oral medication)diet-controlled/ Afib/ dCHF/mild aortic stenosis/arthritis/Chronic iron deficiency.Presented today at [facility] for, abnormal labs.Patient was evaluatedatPCP, labsrevealed potassium of 7.0.  Creatinine was elevated from baseline Per family patient has had a poor p.o. intake since recent death of her brother-in-law. Patient apparently been sleeping, not being interactive with family members, depressed mood. Patient apparently takes medication, diuretics and some potassium supplements.   HOSPITAL COURSE:   AKI on CKD III -Multifactorial likely due to dehydration, poor p.o. intake -BUN/cr.77/2.14(Baseline creatinine 1.59) Patient was started on IV fluids.  Renal function started improving.  Creatinine is now back to baseline.  She feels stronger.  Continue to avoid nephrotoxic agents.  Hyperkalemia -Potassium was 7 on presentation, this is most likely due to worsening renal function and she kept taking her potassium supplements. -Did receive IV glucose with insulin,  calcium gluconate in ED, this has all resolved.  COVID-19 virus infection -Patient was SARS-CoV-2 positive -Patient was afebrile.  Did not have any significant symptoms.  There was no indication to give her steroids or Remdesivir.    Hypotension - BP was as low as88/54 on presentation, improved with IV fluids Continue to hold her hydrochlorothiazide.  Iron deficiency anemia -Chronic, iron deficiency with erythropoietin -H&H stable, monitoring, continue iron supplements  H/oHTN (hypertension) Her hydrochlorothiazide was held.  Blood pressure remains soft.  We will continue to hold HCTZ at discharge.    A-fib -Currently rate controlled with beta-blocker, continue Coreg.  Due to renal failure Xarelto was switched over to Eliquis.  Since her renal function is back to baseline she can resume her home Xarelto.  HLD (hyperlipidemia) -Continue statins  Hypothyroidism -TSH within normal limit -Continue currenthomedose of Synthroid  Chronic diastolic CHF (congestive heart failure) Stable  History of mild aortic stenosis Outpatient monitoring.  H/o DM II -Currently on not any diabetic medications -A1c is 5.4  Overall stable.  Okay for discharge home today.    PERTINENT LABS:  The results of significant diagnostics from this hospitalization (including imaging, microbiology, ancillary and laboratory) are listed below for reference.    Microbiology: Recent Results (from the past 240 hour(s))  SARS Coronavirus 2 (Hosp order,Performed in [facility] lab via [employer] ID)     Status: Abnormal   Collection Time: 08/23/18  2:08 PM   Specimen: Dry Nasal Swab ([employer] ID Now)  Result Value Ref Range Status   SARS Coronavirus 2 ([employer] Now) POSITIVE (A) NEGATIVE Final    Comment: RESULT CALLED TO, READ BACK BY AND VERIFIED WITH: AMY HARTLEY RN @1431  08/23/2018 OLSONM (NOTE) Interpretive Result Comment(s): COVID 19 Positive SARS CoV 2 target nucleic acids are  DETECTED. The SARS CoV 2 RNA is generally detectable in upper and lower  respiratory specimens during the acute phase of infection.  Positive results are indicative of active infection with SARS CoV 2.  Clinical correlation with patient history and other diagnostic information is necessary to determine patient infection status.  Positive results do not rule out bacterial infection or coinfection with other viruses. The expected result is Negative. COVID 19 Negative SARS CoV 2 target nucleic acids are NOT DETECTED. The SARS CoV 2 RNA is generally detectable in upper and lower respiratory specimens during the acute phase of infection.  Negative results do not preclude SARS CoV 2 infection, do not rule out coinfections with other pathogens, and should not be used as the sole basis for treatme nt or other patient management decisions.  Negative results must be combined with clinical observations, patient history, and epidemiological information. The expected result is Negative. Invalid Presence or absence of SARS CoV 2 nucleic acids cannot be determined. Repeat testing was performed on the submitted specimen and repeated Invalid results were obtained.  If clinically indicated, additional testing on a new specimen with an alternate test methodology [phone]) is advised.  The SARS CoV 2 RNA is generally detectable in upper and lower respiratory specimens during the acute phase of infection. The expected result is Negative. Fact Sheet for Patients:  GolfingFamily.no Fact Sheet for Healthcare Providers: https://www.hernandez-brewer.com/ This test is not yet approved or cleared by the [place] FDA and has been authorized for detection and/or diagnosis of SARS CoV 2 by FDA under an Emergency Use Authorization (EUA).  This EU A will remain in effect (meaning this test can be used) for the duration of the COVID19 declaration under Section 564(b)(1) of the  Act, 21 U.S.C. section [phone] 3(b)(1), unless the authorization is terminated or revoked sooner. Performed at [facility], [address] [place], [place] [address]      Labs: Basic Metabolic Panel: Recent Labs  Lab 08/23/18 1053 08/23/18 1313 08/23/18 2255 08/24/18 0453 08/25/18 0355  NA 136 134* 138 140 137  K 7.0* 6.6* 5.3* 4.6 3.9  CL 105 109 108 107 108  CO2 20* 14* 20* 23 22  GLUCOSE 92 85 75 68* 76  BUN 77* 73* 65* 61* 51*  CREATININE 2.14* 2.16* 1.90* 1.82* 1.47*  CALCIUM 10.2 9.4 9.2 8.9 8.2*  MG  --  2.1  --  1.8 1.7  PHOS  --   --   --  3.2 2.8   Liver Function Tests: Recent Labs  Lab 08/23/18 1053 08/23/18 2255 08/24/18 0453 08/25/18 0355  AST 12* 15 13* 14*  ALT 6 9 8 8   ALKPHOS 56 52 43 42  BILITOT 0.7 0.8 0.8 0.6  PROT 6.6 6.3* 5.6* 5.0*  ALBUMIN 4.0 3.5 3.1* 2.7*   CBC: Recent Labs  Lab 08/23/18 1053 08/23/18 2255 08/24/18 0453 08/25/18 0355  WBC 5.9 6.6 5.1 5.4  NEUTROABS 4.6  --  3.9 3.9  HGB 12.2 11.1* 9.8* 9.3*  HCT 38.5 34.6* 31.0* 29.6*  MCV 95.3 95.6 95.4 96.1  PLT 184 156 125* 100*   Cardiac Enzymes: Recent Labs  Lab 08/24/18 0453 08/25/18 0355  CKTOTAL 27* 36*    CBG: Recent Labs  Lab 08/24/18 1325 08/24/18 1645 08/24/18 2155 08/25/18 0812 08/25/18 1208  GLUCAP 115* 101* 82 98 94     DISCHARGE EXAMINATION: Vitals:   08/24/18 2002 08/25/18 0046 08/25/18 0438 08/25/18 0830  BP: (!) 112/56 (!) 101/53 91/69 (!) 101/53  Pulse: 70 85 78   Resp: 16 (!) 24  20   Temp: 98.7 F (37.1 C) 98.3 F (36.8 C) 98.2 F (36.8 C) 97.9 F (36.6 C)  TempSrc: Oral Oral Oral Oral  SpO2: 100% 96% 100%   Weight:      Height:       General appearance: Awake alert.  In no distress Resp: Clear to auscultation bilaterally.  Normal effort Cardio: S1-S2 is normal regular.  No S3-S4.  No rubs murmurs or bruit GI: Abdomen is soft.  Nontender nondistended.  Bowel sounds are present normal.  No masses organomegaly     DISPOSITION: Home  Discharge Instructions    Call MD for:  difficulty breathing, headache or visual disturbances   Complete by: As directed    Call MD for:  extreme fatigue   Complete by: As directed    Call MD for:  persistant dizziness or light-headedness   Complete by: As directed    Call MD for:  persistant nausea and vomiting   Complete by: As directed    Call MD for:  severe uncontrolled pain   Complete by: As directed    Call MD for:  temperature >100.4   Complete by: As directed    Diet general   Complete by: As directed    Discharge instructions   Complete by: As directed    Please be sure to follow-up with your primary care provider in 1 to 2 weeks.  You were cared for by a hospitalist during your hospital stay. If you have any questions about your discharge medications or the care you received while you were in the hospital after you are discharged, you can call the unit and asked to speak with the hospitalist on call if the hospitalist that took care of you is not available. Once you are discharged, your primary care physician will handle any further medical issues. Please note that NO REFILLS for any discharge medications will be authorized once you are discharged, as it is imperative that you return to your primary care physician (or establish a relationship with a primary care physician if you do not have one) for your aftercare needs so that they can reassess your need for medications and monitor your lab values. If you do not have a primary care physician, you can call [phone] for a physician referral.   Increase activity slowly   Complete by: As directed         Allergies as of 08/25/2018      Reactions   Codeine Other (See Comments)   unknown   Furosemide Other (See Comments)   Renal    Oxaprozin Nausea Only   Ramipril Cough      Medication List    STOP taking these medications   hydrochlorothiazide 25 MG tablet Commonly known as: HYDRODIURIL   Micro-K  10 MEQ CR capsule Generic drug: potassium chloride     TAKE these medications   carvedilol 12.5 MG tablet Commonly known as: COREG Take 12.5 mg by mouth 2 (two) times daily with a meal. Notes to patient: 08/25/18 tonight   cyanocobalamin 100 MCG tablet Take 100 mcg by mouth.   escitalopram 5 MG tablet Commonly known as: LEXAPRO Take 5 mg by mouth at bedtime.   levothyroxine 50 MCG tablet Commonly known as: SYNTHROID Take 50 mcg by mouth daily. Notes to patient: 08/26/18   Nu-Iron 150 MG capsule Generic drug: iron polysaccharides Take 150 mg by mouth at bedtime. Notes to patient: 08/26/18   Rivaroxaban 15 MG Tabs tablet Commonly known as:  XARELTO Take 15 mg by mouth daily with supper. Notes to patient: 08/26/18    Vitamin D3 25 MCG (1000 UT) Caps Take 1,000 Units by mouth daily. Notes to patient: 08/26/18            Durable Medical Equipment  (From admission, onward)         Start     Ordered   08/25/18 1042  For home use only DME Shower stool  Once     08/25/18 1041           Follow-up Information    [facility], [place], [place]. Schedule an appointment as soon as possible for a visit in 1 week(s).   Specialty: Physician Assistant Contact information: [id number] [place] [address] [phone]           TOTAL DISCHARGE TIME: 30 mins  [place]  [facility] operational officer on [email or web address]  08/25/2018, 12:39 PM

## 2018-08-26 ENCOUNTER — Emergency Department (HOSPITAL_COMMUNITY): Payer: Medicare HMO

## 2018-08-26 ENCOUNTER — Other Ambulatory Visit: Payer: Self-pay

## 2018-08-26 ENCOUNTER — Encounter (HOSPITAL_COMMUNITY): Payer: Self-pay | Admitting: Emergency Medicine

## 2018-08-26 ENCOUNTER — Emergency Department (HOSPITAL_COMMUNITY)
Admission: EM | Admit: 2018-08-26 | Discharge: 2018-08-27 | Disposition: A | Discharge status: Home / Self Care | Payer: Medicare HMO | Attending: Emergency Medicine | Admitting: Emergency Medicine

## 2018-08-26 DIAGNOSIS — S50812A Abrasion of left forearm, initial encounter: Secondary | ICD-10-CM | POA: Insufficient documentation

## 2018-08-26 DIAGNOSIS — Y998 Other external cause status: Secondary | ICD-10-CM | POA: Insufficient documentation

## 2018-08-26 DIAGNOSIS — E039 Hypothyroidism, unspecified: Secondary | ICD-10-CM | POA: Diagnosis not present

## 2018-08-26 DIAGNOSIS — I5032 Chronic diastolic (congestive) heart failure: Secondary | ICD-10-CM | POA: Diagnosis not present

## 2018-08-26 DIAGNOSIS — Z79899 Other long term (current) drug therapy: Secondary | ICD-10-CM | POA: Diagnosis not present

## 2018-08-26 DIAGNOSIS — T148XXA Other injury of unspecified body region, initial encounter: Secondary | ICD-10-CM

## 2018-08-26 DIAGNOSIS — Y9389 Activity, other specified: Secondary | ICD-10-CM | POA: Insufficient documentation

## 2018-08-26 DIAGNOSIS — S0003XA Contusion of scalp, initial encounter: Secondary | ICD-10-CM | POA: Diagnosis not present

## 2018-08-26 DIAGNOSIS — Z7901 Long term (current) use of anticoagulants: Secondary | ICD-10-CM | POA: Diagnosis not present

## 2018-08-26 DIAGNOSIS — S0990XA Unspecified injury of head, initial encounter: Secondary | ICD-10-CM

## 2018-08-26 DIAGNOSIS — Y929 Unspecified place or not applicable: Secondary | ICD-10-CM | POA: Diagnosis not present

## 2018-08-26 DIAGNOSIS — E119 Type 2 diabetes mellitus without complications: Secondary | ICD-10-CM | POA: Diagnosis not present

## 2018-08-26 DIAGNOSIS — W19XXXA Unspecified fall, initial encounter: Secondary | ICD-10-CM | POA: Insufficient documentation

## 2018-08-26 DIAGNOSIS — I110 Hypertensive heart disease with heart failure: Secondary | ICD-10-CM | POA: Insufficient documentation

## 2018-08-26 LAB — PROTIME-INR
INR: 1.3 — ABNORMAL HIGH (ref 0.8–1.2)
Prothrombin Time: 15.7 seconds — ABNORMAL HIGH (ref 11.4–15.2)

## 2018-08-26 LAB — CBC WITH DIFFERENTIAL/PLATELET
Abs Immature Granulocytes: 0.06 10*3/uL (ref 0.00–0.07)
Basophils Absolute: 0 10*3/uL (ref 0.0–0.1)
Basophils Relative: 0 %
Eosinophils Absolute: 0.1 10*3/uL (ref 0.0–0.5)
Eosinophils Relative: 1 %
HCT: 32.9 % — ABNORMAL LOW (ref 36.0–46.0)
Hemoglobin: 10.6 g/dL — ABNORMAL LOW (ref 12.0–15.0)
Immature Granulocytes: 1 %
Lymphocytes Relative: 14 %
Lymphs Abs: 1 10*3/uL (ref 0.7–4.0)
MCH: 30.5 pg (ref 26.0–34.0)
MCHC: 32.2 g/dL (ref 30.0–36.0)
MCV: 94.8 fL (ref 80.0–100.0)
Monocytes Absolute: 0.5 10*3/uL (ref 0.1–1.0)
Monocytes Relative: 6 %
Neutro Abs: 6 10*3/uL (ref 1.7–7.7)
Neutrophils Relative %: 78 %
Platelets: 95 10*3/uL — ABNORMAL LOW (ref 150–400)
RBC: 3.47 MIL/uL — ABNORMAL LOW (ref 3.87–5.11)
RDW: 13.2 % (ref 11.5–15.5)
WBC: 7.6 10*3/uL (ref 4.0–10.5)
nRBC: 0 % (ref 0.0–0.2)

## 2018-08-26 LAB — BASIC METABOLIC PANEL
Anion gap: 12 (ref 5–15)
BUN: 38 mg/dL — ABNORMAL HIGH (ref 8–23)
CO2: 18 mmol/L — ABNORMAL LOW (ref 22–32)
Calcium: 8.9 mg/dL (ref 8.9–10.3)
Chloride: 102 mmol/L (ref 98–111)
Creatinine, Ser: 1.8 mg/dL — ABNORMAL HIGH (ref 0.44–1.00)
GFR calc Af Amer: 28 mL/min — ABNORMAL LOW (ref >60)
GFR calc non Af Amer: 24 mL/min — ABNORMAL LOW (ref >60)
Glucose, Bld: 82 mg/dL (ref 70–99)
Potassium: 4.3 mmol/L (ref 3.5–5.1)
Sodium: 132 mmol/L — ABNORMAL LOW (ref 135–145)

## 2018-08-26 LAB — INTERLEUKIN-6, PLASMA: Interleukin-6, Plasma: 14.6 pg/mL — ABNORMAL HIGH (ref 0.0–12.2)

## 2018-08-26 NOTE — ED Provider Notes (Signed)
[facility] EMERGENCY DEPARTMENT Provider Note   CSN: [id number] Arrival date & time: 08/26/18  2112     History   Chief Complaint Chief Complaint  Patient presents with   Fall    HPI Faith Holmes is a 85 y.o. female.  She was recently diagnosed with Covid.  She also just got discharged from the hospital yesterday after being admitted for a high potassium and not eating and drinking much.  She said she was walking and became a little unbalanced and fell to the ground striking her head.  Denies loss of consciousness.  No headache no chest pain no shortness of breath no numbness or weakness.  She is on anticoagulation.     The history is provided by the patient and the EMS personnel.  Fall This is a new problem. The current episode started less than 1 hour ago. The problem has been resolved. Pertinent negatives include no chest pain, no abdominal pain, no headaches and no shortness of breath. Nothing aggravates the symptoms. Nothing relieves the symptoms. She has tried nothing for the symptoms. The treatment provided no relief.    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   Mild aortic stenosis    Thrombocytopenia ([facility])    Thyroid disease    hypothyroidism    Patient Active Problem List   Diagnosis Date Noted   Hyperkalemia 08/23/2018   ARF (acute renal failure) ([place]) 08/23/2018   COVID-19 virus infection 08/23/2018   Hypotension 08/23/2018   Dehydration 08/23/2018   HTN (hypertension) 08/23/2018   A-fib ([place]) 08/23/2018   HLD (hyperlipidemia) 08/23/2018   Hypothyroidism 08/23/2018   Acute on chronic diastolic CHF (congestive heart failure) ([place]) 08/23/2018    Iron deficiency anemia 07/01/2017   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      Home Medications    Prior to Admission medications   Medication Sig Start Date End Date Taking? Authorizing Provider  carvedilol (COREG) 12.5 MG tablet Take 12.5 mg by mouth 2 (two) times daily with a meal.    [provider]  Cholecalciferol (VITAMIN D3) 1000 units CAPS Take 1,000 Units by mouth daily.    [provider]  cyanocobalamin 100 MCG tablet Take 100 mcg by mouth.    [provider]  escitalopram (LEXAPRO) 5 MG tablet Take 5 mg by mouth at bedtime.    [provider]  iron polysaccharides (NU-IRON) 150 MG capsule Take 150 mg by mouth at bedtime.     [provider]  levothyroxine (SYNTHROID, LEVOTHROID) 50 MCG tablet Take 50 mcg by mouth daily.    [provider]  Rivaroxaban (XARELTO) 15 MG TABS tablet Take 15 mg by mouth daily with supper.    [provider]    Family History No family history on file.  Social History Social History   Tobacco Use   Smoking status:  Never Smoker   Smokeless tobacco: Never Used  Substance Use Topics   Alcohol use: Not Currently   Drug use: Not on file     Allergies   Codeine, Furosemide, Oxaprozin, and Ramipril   Review of Systems Review of Systems  Constitutional: Positive for appetite change. Negative for fever.  HENT: Negative for sore throat.   Eyes: Negative for visual disturbance.  Respiratory: Negative for shortness of breath.   Cardiovascular: Negative for chest pain.  Gastrointestinal: Negative for abdominal pain.  Genitourinary: Negative for dysuria.  Musculoskeletal: Negative for neck  pain.  Skin: Negative for rash.  Neurological: Negative for headaches.     Physical Exam Updated Vital Signs BP 114/73    Pulse (!) 45    Resp 11    Ht 5' (1.524 m)    Wt 69.4 kg    SpO2 100%    BMI 29.88 kg/m   Physical Exam Vitals signs and nursing note reviewed.  Constitutional:      General: She is not in acute distress.    Appearance: She is well-developed.  HENT:     Head: Normocephalic.     Comments: She is a 4 cm hematoma on the back of her head.  There is no active bleeding. Eyes:     Conjunctiva/sclera: Conjunctivae normal.  Neck:     Musculoskeletal: Neck supple.  Cardiovascular:     Rate and Rhythm: Normal rate and regular rhythm.     Heart sounds: No murmur.  Pulmonary:     Effort: Pulmonary effort is normal. No respiratory distress.     Breath sounds: Normal breath sounds.  Abdominal:     Palpations: Abdomen is soft.     Tenderness: There is no abdominal tenderness.  Musculoskeletal: Normal range of motion.     Comments: She is a small area of abrasion on her left forearm and she has some bruising where she had prior IV sticks.  Skin:    General: Skin is warm and dry.     Capillary Refill: Capillary refill takes less than 2 seconds.  Neurological:     General: No focal deficit present.     Mental Status: She is alert. Mental status is at baseline.     Comments: She is awake alert and conversant.  She recalls the fall.  She is moving all extremities non-focally with no limitations.      ED Treatments / Results  Labs (all labs ordered are listed, but only abnormal results are displayed) Labs Reviewed  BASIC METABOLIC PANEL - Abnormal; Notable for the following components:      Result Value   Sodium 132 (*)    CO2 18 (*)    BUN 38 (*)    Creatinine, Ser 1.80 (*)    GFR calc non Af Amer 24 (*)    GFR calc Af Amer 28 (*)    All other components within normal limits  CBC WITH DIFFERENTIAL/PLATELET - Abnormal; Notable for the following components:    RBC 3.47 (*)    Hemoglobin 10.6 (*)    HCT 32.9 (*)    Platelets 95 (*)    All other components within normal limits  PROTIME-INR - Abnormal; Notable for the following components:   Prothrombin Time 15.7 (*)    INR 1.3 (*)    All other components within normal limits    EKG None  Radiology Ct Head Wo Contrast  Result Date: 08/26/2018 CLINICAL DATA:  Fall while on anti coagulation treatment. EXAM:  CT HEAD WITHOUT CONTRAST CT CERVICAL SPINE WITHOUT CONTRAST TECHNIQUE: Multidetector CT imaging of the head and cervical spine was performed following the standard protocol without intravenous contrast. Multiplanar CT image reconstructions of the cervical spine were also generated. COMPARISON:  Head CT 05/16/2009 FINDINGS: CT HEAD FINDINGS Brain: There is no mass, hemorrhage or extra-axial collection. There is generalized atrophy without lobar predilection. There is hypoattenuation of the periventricular white matter, most commonly indicating chronic ischemic microangiopathy. Vascular: No abnormal hyperdensity of the major intracranial arteries or dural venous sinuses. No intracranial atherosclerosis. Skull: Large left parietal scalp hematoma.  No skull fracture. Sinuses/Orbits: No fluid levels or advanced mucosal thickening of the visualized paranasal sinuses. No mastoid or middle ear effusion. The orbits are normal. CT CERVICAL SPINE FINDINGS Alignment: No static subluxation. Facets are aligned. Occipital condyles are normally positioned. Skull base and vertebrae: No acute fracture. Soft tissues and spinal canal: No prevertebral fluid or swelling. No visible canal hematoma. Disc levels: No advanced spinal canal or neural foraminal stenosis. Upper chest: No pneumothorax, pulmonary nodule or pleural effusion. Other: Normal visualized paraspinal cervical soft tissues. IMPRESSION: 1. No acute intracranial abnormality. 2. No acute abnormality of the cervical spine. 3. Large left parietal scalp hematoma without  skull fracture. Electronically Signed   By: Ulyses Jarred M.D.   On: 08/26/2018 22:31   Ct Cervical Spine Wo Contrast  Result Date: 08/26/2018 CLINICAL DATA:  Fall while on anti coagulation treatment. EXAM: CT HEAD WITHOUT CONTRAST CT CERVICAL SPINE WITHOUT CONTRAST TECHNIQUE: Multidetector CT imaging of the head and cervical spine was performed following the standard protocol without intravenous contrast. Multiplanar CT image reconstructions of the cervical spine were also generated. COMPARISON:  Head CT 05/16/2009 FINDINGS: CT HEAD FINDINGS Brain: There is no mass, hemorrhage or extra-axial collection. There is generalized atrophy without lobar predilection. There is hypoattenuation of the periventricular white matter, most commonly indicating chronic ischemic microangiopathy. Vascular: No abnormal hyperdensity of the major intracranial arteries or dural venous sinuses. No intracranial atherosclerosis. Skull: Large left parietal scalp hematoma.  No skull fracture. Sinuses/Orbits: No fluid levels or advanced mucosal thickening of the visualized paranasal sinuses. No mastoid or middle ear effusion. The orbits are normal. CT CERVICAL SPINE FINDINGS Alignment: No static subluxation. Facets are aligned. Occipital condyles are normally positioned. Skull base and vertebrae: No acute fracture. Soft tissues and spinal canal: No prevertebral fluid or swelling. No visible canal hematoma. Disc levels: No advanced spinal canal or neural foraminal stenosis. Upper chest: No pneumothorax, pulmonary nodule or pleural effusion. Other: Normal visualized paraspinal cervical soft tissues. IMPRESSION: 1. No acute intracranial abnormality. 2. No acute abnormality of the cervical spine. 3. Large left parietal scalp hematoma without skull fracture. Electronically Signed   By: Ulyses Jarred M.D.   On: 08/26/2018 22:31    Procedures Procedures (including critical care time)  Medications Ordered in ED Medications - No data to  display   Initial Impression / Assessment and Plan / ED Course  I have reviewed the triage vital signs and the nursing notes.  Pertinent labs & imaging results that were available during my care of the patient were reviewed by me and considered in my medical decision making (see chart for details).        Final Clinical Impressions(s) / ED Diagnoses   Final diagnoses:  Injury of head, initial encounter  Contusion of scalp, initial encounter  Skin abrasion  Fall, initial encounter    ED Discharge Orders    None  Hayden Rasmussen, MD 08/27/18 [phone]

## 2018-08-26 NOTE — ED Notes (Signed)
Pts daughter in law is on the way to pick up patient for discharge

## 2018-08-26 NOTE — Discharge Instructions (Signed)
You were seen in the emergency department for evaluation of injuries from a fall.  You had a large hematoma on your scalp.  You had CAT scans of your head and neck along with blood work.  Will be important for you to follow-up with your doctor for further evaluation.  Please return if any concerns.

## 2018-08-26 NOTE — ED Notes (Signed)
During ambulation patient admitted to feeling dizzy and slightly short of breath. Patient oxygen was 93% RA while ambulating, 97% RA at rest. Patient denied any pain while ambulating.

## 2018-08-26 NOTE — ED Notes (Signed)
[facility] IS HERE TO PICK UP

## 2018-08-26 NOTE — ED Triage Notes (Signed)
[facility]- pt here for a mechanical fall. Pt has a hematoma to the back left of head. No LOC. Pt is COVID positive. No other complaints at this time. Pt is on blood thinners

## 2018-09-01 ENCOUNTER — Emergency Department (HOSPITAL_COMMUNITY): Payer: Medicare HMO

## 2018-09-01 ENCOUNTER — Emergency Department (HOSPITAL_COMMUNITY)
Admission: EM | Admit: 2018-09-01 | Discharge: 2018-09-01 | Disposition: A | Discharge status: Home / Self Care | Payer: Medicare HMO | Attending: Emergency Medicine | Admitting: Emergency Medicine

## 2018-09-01 DIAGNOSIS — I110 Hypertensive heart disease with heart failure: Secondary | ICD-10-CM | POA: Diagnosis not present

## 2018-09-01 DIAGNOSIS — S81011A Laceration without foreign body, right knee, initial encounter: Secondary | ICD-10-CM | POA: Diagnosis not present

## 2018-09-01 DIAGNOSIS — Y9301 Activity, walking, marching and hiking: Secondary | ICD-10-CM | POA: Diagnosis not present

## 2018-09-01 DIAGNOSIS — W19XXXA Unspecified fall, initial encounter: Secondary | ICD-10-CM

## 2018-09-01 DIAGNOSIS — Y92018 Other place in single-family (private) house as the place of occurrence of the external cause: Secondary | ICD-10-CM | POA: Insufficient documentation

## 2018-09-01 DIAGNOSIS — I5031 Acute diastolic (congestive) heart failure: Secondary | ICD-10-CM | POA: Insufficient documentation

## 2018-09-01 DIAGNOSIS — M25531 Pain in right wrist: Secondary | ICD-10-CM | POA: Insufficient documentation

## 2018-09-01 DIAGNOSIS — S0003XA Contusion of scalp, initial encounter: Secondary | ICD-10-CM | POA: Insufficient documentation

## 2018-09-01 DIAGNOSIS — Y999 Unspecified external cause status: Secondary | ICD-10-CM | POA: Diagnosis not present

## 2018-09-01 DIAGNOSIS — S8991XA Unspecified injury of right lower leg, initial encounter: Secondary | ICD-10-CM | POA: Diagnosis present

## 2018-09-01 DIAGNOSIS — E039 Hypothyroidism, unspecified: Secondary | ICD-10-CM | POA: Diagnosis not present

## 2018-09-01 DIAGNOSIS — S51811A Laceration without foreign body of right forearm, initial encounter: Secondary | ICD-10-CM | POA: Insufficient documentation

## 2018-09-01 DIAGNOSIS — S81811A Laceration without foreign body, right lower leg, initial encounter: Secondary | ICD-10-CM

## 2018-09-01 DIAGNOSIS — S0990XA Unspecified injury of head, initial encounter: Secondary | ICD-10-CM

## 2018-09-01 DIAGNOSIS — W108XXA Fall (on) (from) other stairs and steps, initial encounter: Secondary | ICD-10-CM | POA: Insufficient documentation

## 2018-09-01 DIAGNOSIS — M25561 Pain in right knee: Secondary | ICD-10-CM

## 2018-09-01 NOTE — ED Notes (Signed)
Pt wheeled out to meet family member at ED entrance.

## 2018-09-01 NOTE — Discharge Instructions (Signed)
You were seen in the emergency department today after fall.  Please change the knee dressing every 24 hours for the next 5 to 7 days.  The Steri-Strips on the right arm can be removed after 7 to 9 days.  Return to the emergency department sooner if you develop redness, drainage, fever, shortness of breath, chest pain, or other new or suddenly worsening symptoms.

## 2018-09-01 NOTE — ED Provider Notes (Signed)
Emergency Department Provider Note   I have reviewed the triage vital signs and the nursing notes.   HISTORY  Chief Complaint Fall (COVID +)   HPI Faith Holmes is a 85 y.o. female with PMH reviewed below and known COVID positive patient presents to the emergency department for evaluation after unwitnessed fall.  Patient states that she was walking down the stairs and using her walker when she went to pivot and lost her balance falling to the ground.  She landed on her right knee and right arm.  She is hurting in these locations but denies head injury or trauma.  She did have a fall in the recent past resulting in head injury.  Patient lives alone but family checks on her frequently.  She denies any shortness of breath, cough, or other worsening flulike symptoms.  No abdominal pain.  She states that she has having soreness of her buttocks from sitting and riding in the ambulance but did not have pain there after the fall.    Past Medical History:  Diagnosis Date  . Arthritis   . Atrial fibrillation ([place])   . Diabetes mellitus ([place])   . Diastolic dysfunction   . 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 Gout   . Hard of hearing   . High cholesterol   . Hypertension   . Iron deficiency anemia 07/01/2017  . 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 Mild aortic stenosis   . Thrombocytopenia ([place])   . Thyroid disease    hypothyroidism    Patient Active Problem List   Diagnosis Date Noted  . Hyperkalemia 08/23/2018  . ARF (acute renal failure) ([place]) 08/23/2018  . COVID-19 virus infection 08/23/2018  . Hypotension 08/23/2018  . Dehydration 08/23/2018  . HTN (hypertension) 08/23/2018  . A-fib ([place]) 08/23/2018  . HLD (hyperlipidemia) 08/23/2018  . Hypothyroidism 08/23/2018  . Acute on chronic diastolic CHF (congestive heart failure) ([place]) 08/23/2018  . Iron deficiency anemia 07/01/2017  . 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 DOPPLER ECHOCARDIOGRAPHY     mild diastolic dysfuntion, trivial mitral and tricuspid valve regurgitation  . Mild-moderate AV stenosis      Allergies Codeine, Furosemide, Oxaprozin, and Ramipril  No family history on file.  Social History Social History   Tobacco Use  . Smoking status: Never Smoker  . Smokeless tobacco: Never Used  Substance Use Topics  . Alcohol use: Not Currently  . Drug use: Not on file    Review of Systems  Constitutional: No fever/chills Eyes: No visual changes. ENT: No sore throat. Cardiovascular: Denies chest pain. Respiratory: Denies shortness of breath. Gastrointestinal: No abdominal pain.  No nausea, no vomiting.  No diarrhea.  No constipation. Genitourinary: Negative for dysuria. Musculoskeletal: Negative for back pain. Positive buttock pain, right knee pain, and right wrist pain.  Skin: Negative for rash. Neurological: Negative for headaches, focal weakness or numbness.  10-point ROS otherwise negative.  ____________________________________________   PHYSICAL EXAM:  VITAL SIGNS: ED Triage Vitals  Enc Vitals Group     BP 09/01/18 1317 98/61     Pulse Rate 09/01/18 1317 89     Resp 09/01/18 1317 19     Temp 09/01/18 1317 97.8 F (36.6 C)     Temp Source 09/01/18 1317 Oral     SpO2 09/01/18 1314 100 %   Constitutional: Alert and oriented. Well appearing and in no acute distress. Eyes: Conjunctivae are normal.  PERRL. Head: hematoma to the left parietal scalp (appears old). No laceration.  Nose: No congestion/rhinnorhea. Mouth/Throat: Mucous membranes are moist.  Neck: No stridor. No cervical spine tenderness to palpation. Cardiovascular: Normal rate, regular rhythm. Good peripheral circulation. Grossly normal heart sounds.   Respiratory: Normal respiratory effort.  No retractions. Lungs CTAB. Gastrointestinal: Soft and nontender. No distention.  Musculoskeletal: No lower extremity  tenderness nor edema. No gross deformities of extremities. Normal ROM of the bilateral hips.  Neurologic:  Normal speech and language. No gross focal neurologic deficits are appreciated.  Skin:  Skin is warm and dry. Skin tear to the right wrist and right anterior knee.   ____________________________________________  RADIOLOGY  Dg Pelvis 1-2 Views  Result Date: 09/01/2018 CLINICAL DATA:  Recent fall with hip pain, initial encounter EXAM: PELVIS - 1-2 VIEW COMPARISON:  02/07/2009 FINDINGS: There is no evidence of pelvic fracture or diastasis. No pelvic bone lesions are seen. IMPRESSION: No acute abnormality noted. Electronically Signed   By: Inez Catalina M.D.   On: 09/01/2018 17:11   Dg Wrist Complete Right  Result Date: 09/01/2018 CLINICAL DATA:  Recent fall with wrist pain, initial encounter EXAM: RIGHT WRIST - COMPLETE 3+ VIEW COMPARISON:  None. FINDINGS: Degenerative changes of the first [facility] joint are noted. No acute fracture or dislocation is noted. No soft tissue abnormality is seen. IMPRESSION: Degenerative change without acute abnormality. Electronically Signed   By: Inez Catalina M.D.   On: 09/01/2018 17:10   Dg Knee 2 Views Right  Result Date: 09/01/2018 CLINICAL DATA:  Recent fall with right knee pain, initial encounter EXAM: RIGHT KNEE - 2 VIEW COMPARISON:  11/27/2008 FINDINGS: Tricompartmental degenerative changes are noted. No acute fracture or dislocation is seen. No joint effusion is noted. IMPRESSION: Tricompartmental degenerative changes somewhat progressed when compared with the prior exam. No acute abnormality noted. Electronically Signed   By: Inez Catalina M.D.   On: 09/01/2018 17:10   Ct Head Wo Contrast  Result Date: 09/01/2018 CLINICAL DATA:  Recent fall follow-up positive COVID-19 EXAM: CT HEAD WITHOUT CONTRAST TECHNIQUE: Contiguous axial images were obtained from the base of the skull through the vertex without intravenous contrast. COMPARISON:  08/26/2018 FINDINGS: Brain:  Mild atrophic changes are again identified. Chronic white matter ischemic change is noted as well. No findings to suggest acute hemorrhage, acute infarction or space-occupying mass lesion are noted. Vascular: No hyperdense vessel or unexpected calcification. Skull: Normal. Negative for fracture or focal lesion. Sinuses/Orbits: No acute finding. Other: Resolving scalp hematoma on the left near the vertex is seen. IMPRESSION: Chronic atrophic and ischemic changes stable from the prior exam. No acute abnormality noted. Electronically Signed   By: Inez Catalina M.D.   On: 09/01/2018 15:22    ____________________________________________   PROCEDURES  Procedure(s) performed:   Debridement  Date/Time: 09/02/2018 11:16 AM Performed by: Margette Fast, MD Authorized by: Margette Fast, MD  Consent: Verbal consent obtained. Risks and benefits: risks, benefits and alternatives were discussed Consent given by: patient Local anesthesia used: no  Anesthesia: Local anesthesia used: no  Sedation: Patient sedated: no  Patient tolerance: patient tolerated the procedure well with no immediate complications Comments: 10 x 5 cm skin tear over the right knee. Denuded skin flap excised without pain. Underlying tissue well appearing and vascularized. Applied non-stick dressing.   Marland Kitchen.Laceration Repair  Date/Time: 09/02/2018 11:19 AM Performed by: Margette Fast, MD Authorized by: Margette Fast, MD   Consent:    Consent obtained:  Verbal  Consent given by:  Patient   Risks discussed:  Infection, poor wound healing and need for additional repair Anesthesia (see MAR for exact dosages):    Anesthesia method:  None Laceration details:    Location:  Shoulder/arm   Shoulder/arm location:  R lower arm   Length (cm):  4 Repair type:    Repair type:  Simple Pre-procedure details:    Preparation:  Patient was prepped and draped in usual sterile fashion and imaging obtained to evaluate for foreign bodies  Exploration:    Hemostasis achieved with:  Direct pressure   Wound exploration: wound explored through full range of motion and entire depth of wound probed and visualized     Wound extent: no foreign bodies/material noted, no nerve damage noted, no tendon damage noted and no underlying fracture noted     Contaminated: no   Treatment:    Area cleansed with:  Saline   Amount of cleaning:  Standard   Irrigation solution:  Sterile saline   Irrigation method:  Pressure wash   Visualized foreign bodies/material removed: no   Skin repair:    Repair method:  Steri-Strips   Number of Steri-Strips:  2 Approximation:    Approximation:  Close Post-procedure details:    Dressing:  Open (no dressing)   Patient tolerance of procedure:  Tolerated well, no immediate complications     ____________________________________________   INITIAL IMPRESSION / ASSESSMENT AND PLAN / ED COURSE  Pertinent labs & imaging results that were available during my care of the patient were reviewed by me and considered in my medical decision making (see chart for details).   Patient presents to the emergency department for evaluation after mechanical fall.  No head injury or loss of consciousness.  She does have an old hematoma to the scalp.  Plan for CT imaging of the head along with plain films of the pelvis, bilateral knees, right wrist.  Patient is known COVID-19 positive but has normal vital signs including oxygen saturation.  She is not feeling short of breath or having cough.  Afebrile.  No indication for lab work or further imaging at this time.   Imaging reviewed. No acute findings. Wound debridement as above. Dressing applied. Discussed with family by phone regarding dressing changes and they will help the patient with this daily. Discussed PCP follow up. Steri-strips to be removed in 7 days.  ____________________________________________  FINAL CLINICAL IMPRESSION(S) / ED DIAGNOSES  Final diagnoses:   Injury of head, initial encounter  Fall, initial encounter  Right wrist pain  Acute pain of right knee  Noninfected skin tear of right lower extremity, initial encounter    Note:  This document was prepared using Dragon voice recognition software and may include unintentional dictation errors.  Nanda Quinton, MD Emergency Medicine    , [facility], MD 09/02/18 1120

## 2018-09-01 NOTE — ED Notes (Signed)
Bed: WA21 Expected date:  Expected time:  Means of arrival:  Comments: EVS to clean/zap [id number]

## 2018-09-01 NOTE — ED Triage Notes (Signed)
Pt BIBA from home d/t unwitnessed mechanical fall.  Pt was using walker when she fell to her knees, denies LOC, denies head trauma. Pt does take blood thinners.   Pt has skin tear to right knee, also skin tear to right forearm.   Pt AOx4, HOH.

## 2018-09-10 ENCOUNTER — Emergency Department (HOSPITAL_COMMUNITY): Payer: Medicare HMO

## 2018-09-10 ENCOUNTER — Other Ambulatory Visit: Payer: Self-pay

## 2018-09-10 ENCOUNTER — Emergency Department (HOSPITAL_COMMUNITY)
Admission: EM | Admit: 2018-09-10 | Discharge: 2018-09-11 | Disposition: A | Discharge status: Home / Self Care | Payer: Medicare HMO | Attending: Emergency Medicine | Admitting: Emergency Medicine

## 2018-09-10 ENCOUNTER — Encounter (HOSPITAL_COMMUNITY): Payer: Self-pay | Admitting: Emergency Medicine

## 2018-09-10 DIAGNOSIS — N3001 Acute cystitis with hematuria: Secondary | ICD-10-CM | POA: Insufficient documentation

## 2018-09-10 DIAGNOSIS — Z7901 Long term (current) use of anticoagulants: Secondary | ICD-10-CM | POA: Insufficient documentation

## 2018-09-10 DIAGNOSIS — E039 Hypothyroidism, unspecified: Secondary | ICD-10-CM | POA: Diagnosis not present

## 2018-09-10 DIAGNOSIS — I110 Hypertensive heart disease with heart failure: Secondary | ICD-10-CM | POA: Diagnosis not present

## 2018-09-10 DIAGNOSIS — Z79899 Other long term (current) drug therapy: Secondary | ICD-10-CM | POA: Insufficient documentation

## 2018-09-10 DIAGNOSIS — J189 Pneumonia, unspecified organism: Secondary | ICD-10-CM | POA: Insufficient documentation

## 2018-09-10 DIAGNOSIS — I5032 Chronic diastolic (congestive) heart failure: Secondary | ICD-10-CM | POA: Insufficient documentation

## 2018-09-10 DIAGNOSIS — R42 Dizziness and giddiness: Secondary | ICD-10-CM | POA: Diagnosis present

## 2018-09-10 LAB — CBC WITH DIFFERENTIAL/PLATELET
Abs Immature Granulocytes: 0.02 10*3/uL (ref 0.00–0.07)
Basophils Absolute: 0 10*3/uL (ref 0.0–0.1)
Basophils Relative: 1 %
Eosinophils Absolute: 0.1 10*3/uL (ref 0.0–0.5)
Eosinophils Relative: 3 %
HCT: 33.6 % — ABNORMAL LOW (ref 36.0–46.0)
Hemoglobin: 10.5 g/dL — ABNORMAL LOW (ref 12.0–15.0)
Immature Granulocytes: 1 %
Lymphocytes Relative: 20 %
Lymphs Abs: 0.9 10*3/uL (ref 0.7–4.0)
MCH: 31.3 pg (ref 26.0–34.0)
MCHC: 31.3 g/dL (ref 30.0–36.0)
MCV: 100.3 fL — ABNORMAL HIGH (ref 80.0–100.0)
Monocytes Absolute: 0.4 10*3/uL (ref 0.1–1.0)
Monocytes Relative: 8 %
Neutro Abs: 2.8 10*3/uL (ref 1.7–7.7)
Neutrophils Relative %: 67 %
Platelets: 126 10*3/uL — ABNORMAL LOW (ref 150–400)
RBC: 3.35 MIL/uL — ABNORMAL LOW (ref 3.87–5.11)
RDW: 16.9 % — ABNORMAL HIGH (ref 11.5–15.5)
WBC: 4.2 10*3/uL (ref 4.0–10.5)
nRBC: 0 % (ref 0.0–0.2)

## 2018-09-10 LAB — URINALYSIS, MICROSCOPIC (REFLEX)

## 2018-09-10 LAB — URINALYSIS, ROUTINE W REFLEX MICROSCOPIC
Glucose, UA: NEGATIVE mg/dL
Ketones, ur: 15 mg/dL — AB
Nitrite: POSITIVE — AB
Specific Gravity, Urine: 1.015 (ref 1.005–1.030)
pH: 5.5 (ref 5.0–8.0)

## 2018-09-10 LAB — BASIC METABOLIC PANEL
Anion gap: 10 (ref 5–15)
BUN: 28 mg/dL — ABNORMAL HIGH (ref 8–23)
CO2: 22 mmol/L (ref 22–32)
Calcium: 8.3 mg/dL — ABNORMAL LOW (ref 8.9–10.3)
Chloride: 103 mmol/L (ref 98–111)
Creatinine, Ser: 1.38 mg/dL — ABNORMAL HIGH (ref 0.44–1.00)
GFR calc Af Amer: 39 mL/min — ABNORMAL LOW (ref >60)
GFR calc non Af Amer: 34 mL/min — ABNORMAL LOW (ref >60)
Glucose, Bld: 108 mg/dL — ABNORMAL HIGH (ref 70–99)
Potassium: 3 mmol/L — ABNORMAL LOW (ref 3.5–5.1)
Sodium: 135 mmol/L (ref 135–145)

## 2018-09-10 MED ORDER — SODIUM CHLORIDE 0.9 % IV BOLUS
1000.0000 mL | Freq: Once | INTRAVENOUS | Status: AC
  Administered 2018-09-10: 23:00:00 1000 mL via INTRAVENOUS

## 2018-09-10 MED ORDER — POTASSIUM CHLORIDE 10 MEQ/100ML IV SOLN
10.0000 meq | Freq: Once | INTRAVENOUS | Status: AC
  Administered 2018-09-10: 23:00:00 10 meq via INTRAVENOUS
  Filled 2018-09-10: qty 100

## 2018-09-10 NOTE — ED Triage Notes (Signed)
The patient is from home and was discharged 2 weeks ago from [facility] after being diagnosis with Covid 19. Since then her PO intake has been low , urine has been tea colored and she has been dizzy with standing. EMS administered 1200 cc of fluid.    EMS vitals and CBG: 126/60 BP 88 HR 16 Resp Rate 99% O2 sats 97 CBG 98.1 temp

## 2018-09-11 MED ORDER — CEFDINIR 300 MG PO CAPS
300.0000 mg | ORAL_CAPSULE | Freq: Once | ORAL | Status: AC
  Administered 2018-09-11: 300 mg via ORAL
  Filled 2018-09-11: qty 1

## 2018-09-11 MED ORDER — CEFDINIR 300 MG PO CAPS: 300.0000 mg | ORAL_CAPSULE | Freq: Two times a day (BID) | ORAL | 0 refills | Status: AC

## 2018-09-11 NOTE — ED Notes (Signed)
Called to add on urine culture to U/A, remaining urine insufficient from lab to complete task.

## 2018-09-11 NOTE — ED Notes (Signed)
Ptar called 

## 2018-09-11 NOTE — ED Provider Notes (Signed)
[facility] DEPT Provider Note   CSN: [id number] Arrival date & time: 09/10/18  1705     History   Chief Complaint Chief Complaint  Patient presents with  . Failure To Thrive  . Dizziness    HPI CALLEE Faith Holmes is a 85 y.o. female with past medical history as listed below presents to emergency department today with chief complaint of dizziness and weakness x 3 days. Pt was discharged 2 weeks ago from [facility] after being diagnosed with covid. Today she is reporting she has not been drinking as much water as she usually does and has has increased urinary frequency, with tea colored urine. She is also dizzy when standing, describing it as the room is spinning. She has not taken any medications for her symptoms prior to arrival. She denies dysuria, fever, chills, cough, chest pain, abdominal pain, nausea, gross hematuria, diarrhea, recent fall. History provided by patient with additional history obtained from chart review.      Past Medical History:  Diagnosis Date  . Arthritis   . Atrial fibrillation ([place])   . Diabetes mellitus ([place])   . Diastolic dysfunction   . 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 Gout   . Hard of hearing   . High cholesterol   . Hypertension   . Iron deficiency anemia 07/01/2017  . 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 Mild aortic stenosis   . Thrombocytopenia ([place])   . Thyroid disease    hypothyroidism    Patient Active Problem List   Diagnosis Date Noted  . Hyperkalemia 08/23/2018  . ARF (acute renal failure) ([place]) 08/23/2018  . COVID-19 virus infection 08/23/2018  . Hypotension 08/23/2018  . Dehydration 08/23/2018  . HTN (hypertension) 08/23/2018  . A-fib ([place]) 08/23/2018  . HLD (hyperlipidemia) 08/23/2018  . Hypothyroidism 08/23/2018  . Acute on chronic diastolic CHF (congestive heart failure) ([place]) 08/23/2018  . Iron deficiency anemia 07/01/2017  . 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 Erythropoietin deficiency  anemia 07/01/2017    Past Surgical History:  Procedure Laterality Date  . DOPPLER ECHOCARDIOGRAPHY     mild diastolic dysfuntion, trivial mitral and tricuspid valve regurgitation  . Mild-moderate AV stenosis       OB History   No obstetric history on file.      Home Medications    Prior to Admission medications   Medication Sig Start Date End Date Taking? Authorizing Provider  carvedilol (COREG) 12.5 MG tablet Take 12.5 mg by mouth 2 (two) times daily with a meal.   Yes [provider]  Cholecalciferol (VITAMIN D3) 1000 units CAPS Take 1,000 Units by mouth daily.   Yes [provider]  cyanocobalamin 100 MCG tablet Take 100 mcg by mouth daily.    Yes [provider]  escitalopram (LEXAPRO) 5 MG tablet Take 5 mg by mouth at bedtime.   Yes [provider]  iron polysaccharides (NU-IRON) 150 MG capsule Take 150 mg by mouth at bedtime.    Yes [provider]  levothyroxine (SYNTHROID, LEVOTHROID) 50 MCG tablet Take 50 mcg by mouth daily.   Yes [provider]  mupirocin ointment (BACTROBAN) 2 % Apply 1 application topically at bedtime. 09/06/18  Yes [provider]  Rivaroxaban (XARELTO) 15 MG TABS tablet Take 15 mg by mouth at bedtime.    Yes [provider]  cefdinir (OMNICEF) 300 MG capsule Take 1 capsule (300 mg total) by mouth 2 (two) times daily for 10 days. 09/11/18 09/21/18  Alyza Artiaga, Harley Hallmark, PA-C  Family History History reviewed. No pertinent family history.  Social History Social History   Tobacco Use  . Smoking status: Never Smoker  . Smokeless tobacco: Never Used  Substance Use Topics  . Alcohol use: Not Currently  . Drug use: Not on file     Allergies   Codeine, Furosemide, Oxaprozin, and Ramipril   Review of Systems Review of Systems  Constitutional: Negative for chills and fever.  HENT: Negative for congestion, ear discharge, ear pain, sinus pressure, sinus pain and sore throat.    Eyes: Negative for pain and redness.  Respiratory: Negative for cough and shortness of breath.   Cardiovascular: Negative for chest pain.  Gastrointestinal: Negative for abdominal pain, constipation, diarrhea, nausea and vomiting.  Genitourinary: Positive for urgency. Negative for dysuria and hematuria.  Musculoskeletal: Negative for back pain and neck pain.  Skin: Negative for wound.  Neurological: Positive for weakness. Negative for numbness and headaches.     Physical Exam Updated Vital Signs BP (!) 129/59   Pulse 69   Temp 98.7 F (37.1 C) (Oral)   Resp 13   SpO2 99%   Physical Exam Vitals signs and nursing note reviewed.  Constitutional:      General: She is not in acute distress.    Appearance: She is not ill-appearing.  HENT:     Head: Normocephalic and atraumatic.     Right Ear: Tympanic membrane and external ear normal.     Left Ear: Tympanic membrane and external ear normal.     Nose: Nose normal.     Mouth/Throat:     Mouth: Mucous membranes are dry.     Pharynx: Oropharynx is clear.  Eyes:     General: No scleral icterus.       Right eye: No discharge.        Left eye: No discharge.     Extraocular Movements: Extraocular movements intact.     Conjunctiva/sclera: Conjunctivae normal.     Pupils: Pupils are equal, round, and reactive to light.  Neck:     Musculoskeletal: Normal range of motion.     Vascular: No JVD.  Cardiovascular:     Rate and Rhythm: Normal rate and regular rhythm.     Pulses: Normal pulses.          Radial pulses are 2+ on the right side and 2+ on the left side.     Heart sounds: Normal heart sounds.  Pulmonary:     Comments: Lungs clear to auscultation in all fields. Symmetric chest rise. No wheezing, rales, or rhonchi. Abdominal:     Tenderness: There is no right CVA tenderness or left CVA tenderness.     Comments: Abdomen is soft, non-distended, and non-tender in all quadrants. No rigidity, no guarding. No peritoneal signs.   Musculoskeletal: Normal range of motion.  Skin:    General: Skin is warm and dry.     Capillary Refill: Capillary refill takes less than 2 seconds.  Neurological:     Mental Status: She is oriented to person, place, and time.     GCS: GCS eye subscore is 4. GCS verbal subscore is 5. GCS motor subscore is 6.     Comments: Speech is clear and goal oriented, follows commands CN III-XII intact, no facial droop Normal strength in upper and lower extremities bilaterally including dorsiflexion and plantar flexion, strong and equal grip strength Sensation normal to light and sharp touch Moves extremities without ataxia, coordination intact Normal finger to nose and rapid alternating  movements  Psychiatric:        Behavior: Behavior normal.      ED Treatments / Results  Labs (all labs ordered are listed, but only abnormal results are displayed) Labs Reviewed  BASIC METABOLIC PANEL - Abnormal; Notable for the following components:      Result Value   Potassium 3.0 (*)    Glucose, Bld 108 (*)    BUN 28 (*)    Creatinine, Ser 1.38 (*)    Calcium 8.3 (*)    GFR calc non Af Amer 34 (*)    GFR calc Af Amer 39 (*)    All other components within normal limits  CBC WITH DIFFERENTIAL/PLATELET - Abnormal; Notable for the following components:   RBC 3.35 (*)    Hemoglobin 10.5 (*)    HCT 33.6 (*)    MCV 100.3 (*)    RDW 16.9 (*)    Platelets 126 (*)    All other components within normal limits  URINALYSIS, ROUTINE W REFLEX MICROSCOPIC - Abnormal; Notable for the following components:   APPearance TURBID (*)    Hgb urine dipstick LARGE (*)    Bilirubin Urine SMALL (*)    Ketones, ur 15 (*)    Protein, ur TRACE (*)    Nitrite POSITIVE (*)    Leukocytes,Ua MODERATE (*)    All other components within normal limits  URINALYSIS, MICROSCOPIC (REFLEX) - Abnormal; Notable for the following components:   Bacteria, UA MANY (*)    All other components within normal limits  URINE CULTURE     EKG None  Radiology Dg Chest Portable 1 View  Result Date: 09/10/2018 CLINICAL DATA:  Shortness of breath EXAM: PORTABLE CHEST 1 VIEW COMPARISON:  07/03/2009 FINDINGS: Patchy airspace disease at the left base. Stable cardiomediastinal silhouette with aortic atherosclerosis. No pneumothorax. Scoliosis of the spine. IMPRESSION: Patchy airspace disease at the left lung base which may reflect atelectasis or a pneumonia. Electronically Signed   By: Donavan Foil M.D.   On: 09/10/2018 20:11    Procedures Procedures (including critical care time)  Medications Ordered in ED Medications  potassium chloride 10 mEq in 100 mL IVPB (0 mEq Intravenous Stopped 09/11/18 0045)  sodium chloride 0.9 % bolus 1,000 mL (0 mLs Intravenous Stopped 09/11/18 0038)  cefdinir (OMNICEF) capsule 300 mg (300 mg Oral Given 09/11/18 0043)     Initial Impression / Assessment and Plan / ED Course  I have reviewed the triage vital signs and the nursing notes.  Pertinent labs & imaging results that were available during my care of the patient were reviewed by me and considered in my medical decision making (see chart for details).  Pt is overall well appearing, in no acute distress. Afebrile, normotensive. SpO2 is 99% on room air. On exam lungs are clear to auscultation in all fields, she has no abdominal tenderness, no cva tenderness. Neuro exam without focal deficit. Work up significant for hypokalemia 3.0, will replace with IV potassium. Kidney function is improved from previous labs x 2 weeks ago. UA with signs of UTI, urine culture sent. CBC without leukocytosis, stable anemia. IVF given. Chest xray viewed by me shows infiltrate in left lower lobe, possible pneumonia vs atecletasis.   On reassessment pt is feeling improved after fluids, dizziness has resolved. Will discharge home with prescription for omnicef to cover uti and possible pneumonia. Patient is hemodynamically stable, in NAD, and able to ambulate in the ED.  Vitals are reassuring. Evaluation does not show pathology that  would require ongoing emergent intervention or inpatient treatment. I explained the diagnosis to the patient.   Patient is comfortable with above plan and is stable for discharge at this time. All questions were answered prior to disposition. Strict return precautions for returning to the ED were discussed. Encouraged close follow up with PCP, will need potassium rechecked. The patient was discussed with and seen by Dr. Sedonia Small who agrees with the treatment plan.  This note was prepared using Dragon voice recognition software and may include unintentional dictation errors due to the inherent limitations of voice recognition software.    Final Clinical Impressions(s) / ED Diagnoses   Final diagnoses:  Acute cystitis with hematuria  Community acquired pneumonia of left lung, unspecified part of lung    ED Discharge Orders         Ordered    cefdinir (OMNICEF) 300 MG capsule  2 times daily     09/11/18 0049           Cherre Robins, PA-C 09/11/18 1302    Maudie Flakes, MD 09/13/18 [phone]

## 2018-09-11 NOTE — Discharge Instructions (Addendum)
You have been seen today for dizzy and weakness. Please read and follow all provided instructions. Return to the emergency room for worsening condition or new concerning symptoms.    1. Medications:  Prescription sent to your pharmacy for [facility].  This is an antibiotic.  You need to take it as prescribed.  This will treat your urinary tract infection and pneumonia. Continue usual home medications Take medications as prescribed. Please review all of the medicines and only take them if you do not have an allergy to them.    2. Treatment: rest, drink plenty of fluids  3. Follow Up: Please follow up with your primary doctor in 2-5 days for discussion of your diagnoses and further evaluation after today's visit; Call Monday to arrange your follow up.  -Your potassium was low today.  You will need to follow-up with your primary care doctor in 2 to 5 days to have potassium rechecked. -Your urine sample today was not enough to have culture.  Recommend having UA and culture with primary care doctor at follow-up appointment.  It is also a possibility that you have an allergic reaction to any of the medicines that you have been prescribed - Everybody reacts differently to medications and while MOST people have no trouble with most medicines, you may have a reaction such as nausea, vomiting, rash, swelling, shortness of breath. If this is the case, please stop taking the medicine immediately and contact your physician.  ?

## 2018-09-12 LAB — URINE CULTURE: Culture: >=100000 — AB

## 2018-10-17 ENCOUNTER — Other Ambulatory Visit: Payer: Self-pay

## 2018-10-17 ENCOUNTER — Emergency Department (HOSPITAL_COMMUNITY): Payer: Medicare HMO

## 2018-10-17 ENCOUNTER — Inpatient Hospital Stay (HOSPITAL_COMMUNITY)
Admission: EM | Admit: 2018-10-17 | Discharge: 2018-10-24 | DRG: 683 | Disposition: A | Discharge status: Home / Self Care | Payer: Medicare HMO | Attending: Internal Medicine | Admitting: Internal Medicine

## 2018-10-17 DIAGNOSIS — I5032 Chronic diastolic (congestive) heart failure: Secondary | ICD-10-CM | POA: Diagnosis present

## 2018-10-17 DIAGNOSIS — N179 Acute kidney failure, unspecified: Secondary | ICD-10-CM | POA: Diagnosis not present

## 2018-10-17 DIAGNOSIS — S0083XA Contusion of other part of head, initial encounter: Secondary | ICD-10-CM

## 2018-10-17 DIAGNOSIS — Z6820 Body mass index (BMI) 20.0-20.9, adult: Secondary | ICD-10-CM

## 2018-10-17 DIAGNOSIS — G8929 Other chronic pain: Secondary | ICD-10-CM | POA: Diagnosis present

## 2018-10-17 DIAGNOSIS — D539 Nutritional anemia, unspecified: Secondary | ICD-10-CM | POA: Diagnosis present

## 2018-10-17 DIAGNOSIS — F39 Unspecified mood [affective] disorder: Secondary | ICD-10-CM | POA: Diagnosis present

## 2018-10-17 DIAGNOSIS — Z79890 Hormone replacement therapy (postmenopausal): Secondary | ICD-10-CM

## 2018-10-17 DIAGNOSIS — I4819 Other persistent atrial fibrillation: Secondary | ICD-10-CM | POA: Diagnosis present

## 2018-10-17 DIAGNOSIS — E1122 Type 2 diabetes mellitus with diabetic chronic kidney disease: Secondary | ICD-10-CM | POA: Diagnosis present

## 2018-10-17 DIAGNOSIS — E538 Deficiency of other specified B group vitamins: Secondary | ICD-10-CM | POA: Diagnosis present

## 2018-10-17 DIAGNOSIS — I959 Hypotension, unspecified: Secondary | ICD-10-CM | POA: Diagnosis not present

## 2018-10-17 DIAGNOSIS — N184 Chronic kidney disease, stage 4 (severe): Secondary | ICD-10-CM | POA: Diagnosis present

## 2018-10-17 DIAGNOSIS — Z7901 Long term (current) use of anticoagulants: Secondary | ICD-10-CM

## 2018-10-17 DIAGNOSIS — D696 Thrombocytopenia, unspecified: Secondary | ICD-10-CM | POA: Diagnosis present

## 2018-10-17 DIAGNOSIS — Z885 Allergy status to narcotic agent status: Secondary | ICD-10-CM

## 2018-10-17 DIAGNOSIS — W19XXXA Unspecified fall, initial encounter: Secondary | ICD-10-CM | POA: Diagnosis present

## 2018-10-17 DIAGNOSIS — Z79899 Other long term (current) drug therapy: Secondary | ICD-10-CM

## 2018-10-17 DIAGNOSIS — Z66 Do not resuscitate: Secondary | ICD-10-CM | POA: Diagnosis present

## 2018-10-17 DIAGNOSIS — H9190 Unspecified hearing loss, unspecified ear: Secondary | ICD-10-CM | POA: Diagnosis present

## 2018-10-17 DIAGNOSIS — E7800 Pure hypercholesterolemia, unspecified: Secondary | ICD-10-CM | POA: Diagnosis present

## 2018-10-17 DIAGNOSIS — E039 Hypothyroidism, unspecified: Secondary | ICD-10-CM | POA: Diagnosis present

## 2018-10-17 DIAGNOSIS — D509 Iron deficiency anemia, unspecified: Secondary | ICD-10-CM | POA: Diagnosis present

## 2018-10-17 DIAGNOSIS — D631 Anemia in chronic kidney disease: Secondary | ICD-10-CM | POA: Diagnosis present

## 2018-10-17 DIAGNOSIS — Z833 Family history of diabetes mellitus: Secondary | ICD-10-CM

## 2018-10-17 DIAGNOSIS — Z888 Allergy status to other drugs, medicaments and biological substances status: Secondary | ICD-10-CM

## 2018-10-17 DIAGNOSIS — Z20828 Contact with and (suspected) exposure to other viral communicable diseases: Secondary | ICD-10-CM | POA: Diagnosis present

## 2018-10-17 DIAGNOSIS — F05 Delirium due to known physiological condition: Secondary | ICD-10-CM | POA: Diagnosis present

## 2018-10-17 DIAGNOSIS — Y92009 Unspecified place in unspecified non-institutional (private) residence as the place of occurrence of the external cause: Secondary | ICD-10-CM

## 2018-10-17 DIAGNOSIS — R296 Repeated falls: Secondary | ICD-10-CM | POA: Diagnosis present

## 2018-10-17 DIAGNOSIS — M25551 Pain in right hip: Secondary | ICD-10-CM | POA: Diagnosis not present

## 2018-10-17 DIAGNOSIS — E079 Disorder of thyroid, unspecified: Secondary | ICD-10-CM | POA: Diagnosis present

## 2018-10-17 DIAGNOSIS — E871 Hypo-osmolality and hyponatremia: Secondary | ICD-10-CM | POA: Diagnosis present

## 2018-10-17 DIAGNOSIS — I130 Hypertensive heart and chronic kidney disease with heart failure and stage 1 through stage 4 chronic kidney disease, or unspecified chronic kidney disease: Secondary | ICD-10-CM | POA: Diagnosis present

## 2018-10-17 DIAGNOSIS — E860 Dehydration: Secondary | ICD-10-CM | POA: Diagnosis present

## 2018-10-17 DIAGNOSIS — E46 Unspecified protein-calorie malnutrition: Secondary | ICD-10-CM | POA: Diagnosis present

## 2018-10-17 LAB — URINALYSIS, ROUTINE W REFLEX MICROSCOPIC
Bacteria, UA: NONE SEEN
Bilirubin Urine: NEGATIVE
Glucose, UA: NEGATIVE mg/dL
Hgb urine dipstick: NEGATIVE
Ketones, ur: 5 mg/dL — AB
Leukocytes,Ua: NEGATIVE
Nitrite: NEGATIVE
Protein, ur: 30 mg/dL — AB
Specific Gravity, Urine: 1.023 (ref 1.005–1.030)
pH: 5 (ref 5.0–8.0)

## 2018-10-17 LAB — COMPREHENSIVE METABOLIC PANEL
ALT: 15 U/L (ref 0–44)
AST: 22 U/L (ref 15–41)
Albumin: 3.1 g/dL — ABNORMAL LOW (ref 3.5–5.0)
Alkaline Phosphatase: 73 U/L (ref 38–126)
Anion gap: 16 — ABNORMAL HIGH (ref 5–15)
BUN: 30 mg/dL — ABNORMAL HIGH (ref 8–23)
CO2: 16 mmol/L — ABNORMAL LOW (ref 22–32)
Calcium: 8.6 mg/dL — ABNORMAL LOW (ref 8.9–10.3)
Chloride: 99 mmol/L (ref 98–111)
Creatinine, Ser: 2.18 mg/dL — ABNORMAL HIGH (ref 0.44–1.00)
GFR calc Af Amer: 22 mL/min — ABNORMAL LOW (ref >60)
GFR calc non Af Amer: 19 mL/min — ABNORMAL LOW (ref >60)
Glucose, Bld: 161 mg/dL — ABNORMAL HIGH (ref 70–99)
Potassium: 5.2 mmol/L — ABNORMAL HIGH (ref 3.5–5.1)
Sodium: 131 mmol/L — ABNORMAL LOW (ref 135–145)
Total Bilirubin: 1.3 mg/dL — ABNORMAL HIGH (ref 0.3–1.2)
Total Protein: 5.3 g/dL — ABNORMAL LOW (ref 6.5–8.1)

## 2018-10-17 LAB — CBC WITH DIFFERENTIAL/PLATELET
Abs Immature Granulocytes: 0.07 10*3/uL (ref 0.00–0.07)
Basophils Absolute: 0 10*3/uL (ref 0.0–0.1)
Basophils Relative: 0 %
Eosinophils Absolute: 0 10*3/uL (ref 0.0–0.5)
Eosinophils Relative: 0 %
HCT: 31.3 % — ABNORMAL LOW (ref 36.0–46.0)
Hemoglobin: 10.2 g/dL — ABNORMAL LOW (ref 12.0–15.0)
Immature Granulocytes: 1 %
Lymphocytes Relative: 6 %
Lymphs Abs: 0.5 10*3/uL — ABNORMAL LOW (ref 0.7–4.0)
MCH: 33.1 pg (ref 26.0–34.0)
MCHC: 32.6 g/dL (ref 30.0–36.0)
MCV: 101.6 fL — ABNORMAL HIGH (ref 80.0–100.0)
Monocytes Absolute: 0.5 10*3/uL (ref 0.1–1.0)
Monocytes Relative: 6 %
Neutro Abs: 6.5 10*3/uL (ref 1.7–7.7)
Neutrophils Relative %: 87 %
Platelets: 103 10*3/uL — ABNORMAL LOW (ref 150–400)
RBC: 3.08 MIL/uL — ABNORMAL LOW (ref 3.87–5.11)
RDW: 15.3 % (ref 11.5–15.5)
WBC: 7.5 10*3/uL (ref 4.0–10.5)
nRBC: 0 % (ref 0.0–0.2)

## 2018-10-17 LAB — CK: Total CK: 130 U/L (ref 38–234)

## 2018-10-17 LAB — SARS CORONAVIRUS 2 BY RT PCR (HOSPITAL ORDER, PERFORMED IN ~~LOC~~ HOSPITAL LAB): SARS Coronavirus 2: NEGATIVE

## 2018-10-17 MED ORDER — VITAMIN D 25 MCG (1000 UNIT) PO TABS
1000.0000 [IU] | ORAL_TABLET | Freq: Every day | ORAL | Status: DC
  Administered 2018-10-17 – 2018-10-24 (×8): 1000 [IU] via ORAL
  Filled 2018-10-17 (×8): qty 1

## 2018-10-17 MED ORDER — ACETAMINOPHEN 650 MG RE SUPP: 650.0000 mg | Freq: Four times a day (QID) | RECTAL | Status: DC | PRN

## 2018-10-17 MED ORDER — VITAMIN B-12 1000 MCG PO TABS
1000.0000 ug | ORAL_TABLET | Freq: Every day | ORAL | Status: DC
  Administered 2018-10-17 – 2018-10-24 (×8): 1000 ug via ORAL
  Filled 2018-10-17 (×8): qty 1

## 2018-10-17 MED ORDER — SODIUM CHLORIDE 0.9 % IV BOLUS
500.0000 mL | Freq: Once | INTRAVENOUS | Status: AC
  Administered 2018-10-17: 17:00:00 500 mL via INTRAVENOUS

## 2018-10-17 MED ORDER — ACETAMINOPHEN 325 MG PO TABS: 650.0000 mg | ORAL_TABLET | Freq: Four times a day (QID) | ORAL | Status: DC | PRN

## 2018-10-17 MED ORDER — RIVAROXABAN 15 MG PO TABS: 15.0000 mg | ORAL_TABLET | Freq: Every day | ORAL | Status: DC

## 2018-10-17 MED ORDER — ESCITALOPRAM OXALATE 10 MG PO TABS
5.0000 mg | ORAL_TABLET | Freq: Every day | ORAL | Status: DC
  Administered 2018-10-17 – 2018-10-23 (×7): 5 mg via ORAL
  Filled 2018-10-17 (×7): qty 1

## 2018-10-17 MED ORDER — LEVOTHYROXINE SODIUM 50 MCG PO TABS
50.0000 ug | ORAL_TABLET | Freq: Every day | ORAL | Status: DC
  Administered 2018-10-17 – 2018-10-24 (×8): 50 ug via ORAL
  Filled 2018-10-17 (×8): qty 1

## 2018-10-17 MED ORDER — CARVEDILOL 12.5 MG PO TABS
12.5000 mg | ORAL_TABLET | Freq: Two times a day (BID) | ORAL | Status: DC
  Administered 2018-10-17 – 2018-10-19 (×4): 12.5 mg via ORAL
  Filled 2018-10-17 (×5): qty 1

## 2018-10-17 MED ORDER — STERILE WATER FOR INJECTION IV SOLN
INTRAVENOUS | Status: DC
  Administered 2018-10-17: 23:00:00 via INTRAVENOUS
  Filled 2018-10-17 (×4): qty 9.71

## 2018-10-17 MED ORDER — DICLOFENAC SODIUM 1 % TD GEL
2.0000 g | Freq: Four times a day (QID) | TRANSDERMAL | Status: DC
  Administered 2018-10-17 – 2018-10-24 (×25): 2 g via TOPICAL
  Filled 2018-10-17: qty 100

## 2018-10-17 NOTE — ED Triage Notes (Signed)
Patient had an unwitnessed fall at home, found by family partially under the bed with abrasions on the left shoulder and under the ribs. A&O to baseline according to family. States she "hit her head a little bit". History of controlled A fib and previous falls.

## 2018-10-17 NOTE — H&P (Signed)
Date: 10/17/2018               Patient Name:  Faith Holmes MRN: [id number]  DOB: 07-21-1926 Age / Sex: 85 y.o., female   PCP: Faith Holmes              Medical Service: Internal Medicine Teaching Service              Attending Physician: Dr. Rebeca Alert Raynaldo Opitz, MD    First Contact: Faith Holmes, MS4 Pager: [phone]  Second Contact: Dr. Guadlupe Holmes Pager: [phone]            After Hours (After 5p/  First Contact Pager: [phone]  weekends / holidays): Second Contact Pager: [phone]   Chief Complaint: Fall and dehydration  History obtained from daughter-in-law as patient is very hard of hearing and does not have her hearing aid with her.   History of Present Illness:  [name] is a 85 yo female medical history significant for Multiple falls, HTN, Iron Deficiency Anemia, AFIb on Xarelto, Hypothyroidism, and Hearing Loss who is presenting after an unobserved fall early this morning. Daughter-in-law states she found patient lying on the floor near her bed in a conscious state. Patient told her, she fell down early this morning when trying to turn off the light. No one observed the fall and daughter-in-law did not see any bleeding.   She also reports patient has been having increased falls in the last two months. Over the last week, she has also been sundowning more frequently but overall, no major change in her cognitive status. Two weeks ago she was started on Tramadol for Right Hip Pain. One month ago, she was having dizziness, weakness, low water intake, and increased urinary frequency. She was diagnosed with acute cystitis and prescribed a course of cefdinir. She continued to have poor PO intake and only drinks two-three cups of water per day and barely eats any food. Daughter-in-law occasionally checks her BP @ home which is around "105/60". Besides chronic hip pain, denies any other concerns. Uses a walker to ambulate in her house.   In the ED, CT Head & Cervical Spine showed  focal soft tissue hematoma overlying the LEFT orbital roof. No underlying fracture, acute intracranial abnormality, or hemorrhage. No fracture of cervical spine. COVID test was negative. Notably, she was tested positive on 6/22. Labs notable for Na of 131, CO2 16, BUN 30, Cr 2.18, Hgb 10.2.   Meds:  Current Meds  Medication Sig  . carvedilol (COREG) 12.5 MG tablet Take 12.5 mg by mouth 2 (two) times daily with a meal.  . Cholecalciferol (VITAMIN D3) 1000 units CAPS Take 1,000 Units by mouth daily.  . cyanocobalamin 100 MCG tablet Take 100 mcg by mouth daily.   Marland Kitchen escitalopram (LEXAPRO) 5 MG tablet Take 5 mg by mouth at bedtime.  . hydrochlorothiazide (HYDRODIURIL) 12.5 MG tablet Take 12.5 mg by mouth daily.  . iron polysaccharides (NU-IRON) 150 MG capsule Take 150 mg by mouth at bedtime.   Marland Kitchen levothyroxine (SYNTHROID, LEVOTHROID) 50 MCG tablet Take 50 mcg by mouth daily.  . potassium chloride (MICRO-K) 10 MEQ CR capsule Take 10 mEq by mouth 2 (two) times daily.  . Rivaroxaban (XARELTO) 15 MG TABS tablet Take 15 mg by mouth at bedtime.     Allergies: Allergies as of 10/17/2018 - Review Complete 10/17/2018  Allergen Reaction Noted  . Codeine Other (See Comments) 08/02/2011  . Furosemide Other (See Comments) 03/21/2015  . Oxaprozin Nausea Only  08/02/2011  . Ramipril Cough 08/02/2011   Past Medical History:  Diagnosis Date  . Arthritis   . Atrial fibrillation ([place])   . Diabetes mellitus ([place])   . Diastolic dysfunction   . 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 Gout   . Hard of hearing   . High cholesterol   . Hypertension   . Iron deficiency anemia 07/01/2017  . 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 Mild aortic stenosis   . Thrombocytopenia ([place])   . Thyroid disease    hypothyroidism   Family History: Diabetes in mother, thyroid problems in family  Social History: Lives alone in a four bedroom house. Daughter-in-law lives next door who also cooks for her. No tobacco, alcohol or  illicit substances in the past. She has been grieving passing away of brother in law and was started on Lexapro 5mg  in June 2020  Review of Systems: A complete ROS was negative except as per HPI.   Physical Exam: Blood pressure (!) 142/95, pulse 67, temperature (!) 97.4 F (36.3 C), temperature source Oral, resp. rate 16, SpO2 95 %. Physical Exam Constitutional:      General: She is not in acute distress.    Appearance: She is not ill-appearing, toxic-appearing or diaphoretic.  HENT:     Head: Normocephalic and atraumatic.  Eyes:     Extraocular Movements: Extraocular movements intact.  Cardiovascular:     Rate and Rhythm: Normal rate. Rhythm irregular.     Pulses: Normal pulses.     Heart sounds: Normal heart sounds.  Pulmonary:     Effort: Pulmonary effort is normal.  Musculoskeletal: Normal range of motion.  Skin:    General: Skin is dry.     Findings: Bruising present.     Comments: Echymosis around left eye. Diffuse bruising all over body.  Neurological:     General: No focal deficit present.  Psychiatric:        Behavior: Behavior normal.    EKG: personally reviewed my interpretation is Atrial Fibrillation   CXR: none performed  Assessment & Plan by Problem: [name] with history significant for multiple recent falls, Iron Deficiency Anemia, AFIb on Xarelto, Hypothyroidism, HTN, and Hearing Loss who presents with recurrent falls in setting of chronic dehydration. Had a UTI one month ago but UA is neg for nitrites and LEs. WBC 7.5 and patient afebrile.   Dehydration: AKI:  Multifactorial, likely secondary to poor oral intake and dehydration. Cr elevated at 2.18 (baseline Cr of 1.59). BUN of 30. Bicarb of 16. Most likely pre-renal AKI. -IVF: NS Bolus with 500 ml followed by Na-HC03 50 mEq w 1/4 NS @100ml /hr -Avoid nephrotoxins -Strict I/Os  Multiple Falls: Likely due to chronic dehydration and being on multiple BP lowering meds including HCTZ. Per daughter-in-law,  has low BP in 105/60s @ home.  -PT/OT evaluation for functional status and outpatient needs -Discontinued home HCTZ and Tramadol -Check Orthostatic Blood Pressure tomorrow  -Continue home Vit D, B12, and Iron  Chronic R Hip Pain -Voltaren Gel 2g QID -PRN Tylenol   AFIB: -Continue rate control with home Coreg 12.5mg  BID -Resume home xarelto for chronic anticoagulation tomorrow -Cardiac Tele  Mild Hyponatremia: Na of 131 in setting of HCTZ use -Hold HCTZ -Repeat CMP in the AM -Gentle hydration    Iron Deficiency Anemia: Hgb of 10.2 and MCV 101.6. -Chronic in nature, stable H&H in the ED, will periodically trend CBC -Continue PO home iron supplements  History of HTN: -Holding home meds  History of  Hypothyroidism: Continue home Synthroid 64mcg Check TSH  ?Adjustment Disorder: Continue home Lexapro 5mg   VTE Ppx: Xarelto  Diet: heart healthy  Code Status: DNR  Dispo: Admit patient to Observation with expected length of stay less than 2 midnights.  Signed: Zara Holmes, Medical Student 10/17/2018, 6:41 PM  Pager: [phone]

## 2018-10-17 NOTE — ED Notes (Signed)
ED TO INPATIENT HANDOFF REPORT  ED Nurse Name and Phone #:  Darrold Span [phone]  S Name/Age/Gender Faith Holmes 85 y.o. female Room/Bed: 031C/031C  Code Status   Code Status: DNR  Home/SNF/Other Home Patient oriented to: self, place, time and situation Is this baseline? Yes   Triage Complete: Triage complete  Chief Complaint fall  Triage Note Patient had an unwitnessed fall at home, found by family partially under the bed with abrasions on the left shoulder and under the ribs. A&O to baseline according to family. States she "hit her head a little bit". History of controlled A fib and previous falls.   Allergies Allergies  Allergen Reactions  . Codeine Other (See Comments)    unknown  . Furosemide Other (See Comments)    Renal   . Oxaprozin Nausea Only  . Ramipril Cough    Level of Care/Admitting Diagnosis ED Disposition    ED Disposition Condition Comment   Admit  Hospital Area: [place] [[id number]]  Level of Care: Med-Surg [16]  Covid Evaluation: Confirmed COVID Negative  Diagnosis: AKI (acute kidney injury) [facility]) [id number]  Admitting Physician: Oda Kilts [id number]  Attending Physician: Oda Kilts [id number]  PT Class (Do Not Modify): Observation [104]  PT Acc Code (Do Not Modify): Observation [10022]       B Medical/Surgery History Past Medical History:  Diagnosis Date  . Arthritis   . Atrial fibrillation ([place])   . Diabetes mellitus ([place])   . Diastolic dysfunction   . 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 Gout   . Hard of hearing   . High cholesterol   . Hypertension   . Iron deficiency anemia 07/01/2017  . 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 Mild aortic stenosis   . Thrombocytopenia ([place])   . Thyroid disease    hypothyroidism   Past Surgical History:  Procedure Laterality Date  . DOPPLER ECHOCARDIOGRAPHY     mild diastolic dysfuntion, trivial mitral and tricuspid valve regurgitation  . Mild-moderate AV  stenosis       A IV Location/Drains/Wounds Patient Lines/Drains/Airways Status   Active Line/Drains/Airways    Name:   Placement date:   Placement time:   Site:   Days:   Peripheral IV 10/17/18 Anterior Forearm   10/17/18    1546    Forearm   less than 1          Intake/Output Last 24 hours No intake or output data in the 24 hours ending 10/17/18 1628  Labs/Imaging Results for orders placed or performed during the hospital encounter of 10/17/18 (from the past 48 hour(s))  Urinalysis, Routine w reflex microscopic     Status: Abnormal   Collection Time: 10/17/18 10:53 AM  Result Value Ref Range   Color, Urine YELLOW YELLOW   APPearance CLEAR CLEAR   Specific Gravity, Urine 1.023 1.005 - 1.030   pH 5.0 5.0 - 8.0   Glucose, UA NEGATIVE NEGATIVE mg/dL   Hgb urine dipstick NEGATIVE NEGATIVE   Bilirubin Urine NEGATIVE NEGATIVE   Ketones, ur 5 (A) NEGATIVE mg/dL   Protein, ur 30 (A) NEGATIVE mg/dL   Nitrite NEGATIVE NEGATIVE   Leukocytes,Ua NEGATIVE NEGATIVE   Bacteria, UA NONE SEEN NONE SEEN   Squamous Epithelial / LPF 0-5 0 - 5   Mucus PRESENT    Hyaline Casts, UA PRESENT     Comment: Performed at [facility], [address], [place], [place] [address]  Comprehensive metabolic panel  Status: Abnormal   Collection Time: 10/17/18 11:55 AM  Result Value Ref Range   Sodium 131 (L) 135 - 145 mmol/L   Potassium 5.2 (H) 3.5 - 5.1 mmol/L   Chloride 99 98 - 111 mmol/L   CO2 16 (L) 22 - 32 mmol/L   Glucose, Bld 161 (H) 70 - 99 mg/dL   BUN 30 (H) 8 - 23 mg/dL   Creatinine, Ser 2.18 (H) 0.44 - 1.00 mg/dL   Calcium 8.6 (L) 8.9 - 10.3 mg/dL   Total Protein 5.3 (L) 6.5 - 8.1 g/dL   Albumin 3.1 (L) 3.5 - 5.0 g/dL   AST 22 15 - 41 U/L   ALT 15 0 - 44 U/L   Alkaline Phosphatase 73 38 - 126 U/L   Total Bilirubin 1.3 (H) 0.3 - 1.2 mg/dL   GFR calc non Af Amer 19 (L) >60 mL/min   GFR calc Af Amer 22 (L) >60 mL/min   Anion gap 16 (H) 5 - 15    Comment: Performed at [facility], [place] [address] [place], [place] [address]  CBC with Differential     Status: Abnormal   Collection Time: 10/17/18 11:55 AM  Result Value Ref Range   WBC 7.5 4.0 - 10.5 K/uL   RBC 3.08 (L) 3.87 - 5.11 MIL/uL   Hemoglobin 10.2 (L) 12.0 - 15.0 g/dL   HCT 31.3 (L) 36.0 - 46.0 %   MCV 101.6 (H) 80.0 - 100.0 fL   MCH 33.1 26.0 - 34.0 pg   MCHC 32.6 30.0 - 36.0 g/dL   RDW 15.3 11.5 - 15.5 %   Platelets 103 (L) 150 - 400 K/uL    Comment: REPEATED TO VERIFY PLATELET COUNT CONFIRMED BY SMEAR SPECIMEN CHECKED FOR CLOTS Immature Platelet Fraction may be clinically indicated, consider ordering this additional test [id number]    nRBC 0.0 0.0 - 0.2 %   Neutrophils Relative % 87 %   Neutro Abs 6.5 1.7 - 7.7 K/uL   Lymphocytes Relative 6 %   Lymphs Abs 0.5 (L) 0.7 - 4.0 K/uL   Monocytes Relative 6 %   Monocytes Absolute 0.5 0.1 - 1.0 K/uL   Eosinophils Relative 0 %   Eosinophils Absolute 0.0 0.0 - 0.5 K/uL   Basophils Relative 0 %   Basophils Absolute 0.0 0.0 - 0.1 K/uL   Immature Granulocytes 1 %   Abs Immature Granulocytes 0.07 0.00 - 0.07 K/uL    Comment: Performed at [facility], [place] [address] [place], [place] [address]  CK     Status: None   Collection Time: 10/17/18 11:55 AM  Result Value Ref Range   Total CK 130 38 - 234 U/L    Comment: Performed at [facility], [place] [address] [place], [place] [address]  SARS Coronavirus 2 [facility] order, Performed in [facility] hospital lab) Nasopharyngeal Nasopharyngeal Swab     Status: None   Collection Time: 10/17/18  2:01 PM   Specimen: Nasopharyngeal Swab  Result Value Ref Range   SARS Coronavirus 2 NEGATIVE NEGATIVE    Comment: (NOTE) If result is NEGATIVE SARS-CoV-2 target nucleic acids are NOT DETECTED. The SARS-CoV-2 RNA is generally detectable in upper and lower  respiratory specimens during the acute phase of infection. The lowest  concentration of SARS-CoV-2 viral copies this assay can detect is 250   copies / mL. A negative result does not preclude SARS-CoV-2 infection  and should not be used as the sole basis  for treatment or other  patient management decisions.  A negative result may occur with  improper specimen collection / handling, submission of specimen other  than nasopharyngeal swab, presence of viral mutation(s) within the  areas targeted by this assay, and inadequate number of viral copies  (<250 copies / mL). A negative result must be combined with clinical  observations, patient history, and epidemiological information. If result is POSITIVE SARS-CoV-2 target nucleic acids are DETECTED. The SARS-CoV-2 RNA is generally detectable in upper and lower  respiratory specimens dur ing the acute phase of infection.  Positive  results are indicative of active infection with SARS-CoV-2.  Clinical  correlation with patient history and other diagnostic information is  necessary to determine patient infection status.  Positive results do  not rule out bacterial infection or co-infection with other viruses. If result is PRESUMPTIVE POSTIVE SARS-CoV-2 nucleic acids MAY BE PRESENT.   A presumptive positive result was obtained on the submitted specimen  and confirmed on repeat testing.  While 2019 novel coronavirus  (SARS-CoV-2) nucleic acids may be present in the submitted sample  additional confirmatory testing may be necessary for epidemiological  and / or clinical management purposes  to differentiate between  SARS-CoV-2 and other Sarbecovirus currently known to infect humans.  If clinically indicated additional testing with an alternate test  methodology [phone]) is advised. The SARS-CoV-2 RNA is generally  detectable in upper and lower respiratory sp ecimens during the acute  phase of infection. The expected result is Negative. Fact Sheet for Patients:  StrictlyIdeas.no Fact Sheet for Healthcare  Providers: BankingDealers.co.za This test is not yet approved or cleared by the [place] FDA and has been authorized for detection and/or diagnosis of SARS-CoV-2 by FDA under an Emergency Use Authorization (EUA).  This EUA will remain in effect (meaning this test can be used) for the duration of the COVID-19 declaration under Section 564(b)(1) of the Act, 21 U.S.C. section 360bbb-3(b)(1), unless the authorization is terminated or revoked sooner. Performed at [facility], [place] [address] [place], [place] [address]    Dg Pelvis 1-2 Views  Result Date: 10/17/2018 CLINICAL DATA:  Weakness. EXAM: PELVIS - 1-2 VIEW COMPARISON:  09/01/2018 FINDINGS: There is no evidence of pelvic fracture or diastasis. No pelvic bone lesions are seen. IMPRESSION: Negative. Electronically Signed   By: Kerby Moors M.D.   On: 10/17/2018 11:40   Ct Head Wo Contrast  Result Date: 10/17/2018 CLINICAL DATA:  Fall, blunt trauma, on Xarelto. Unwitnessed fall. Abrasions to LEFT shoulder and ribs. EXAM: CT HEAD WITHOUT CONTRAST CT CERVICAL SPINE WITHOUT CONTRAST TECHNIQUE: Multidetector CT imaging of the head and cervical spine was performed following the standard protocol without intravenous contrast. Multiplanar CT image reconstructions of the cervical spine were also generated. COMPARISON:  Head CT dated 09/01/2018. FINDINGS: CT HEAD FINDINGS Brain: Mild generalized age related parenchymal volume loss with commensurate dilatation of the ventricles and sulci. Chronic small vessel ischemic changes again noted within the bilateral periventricular and subcortical white matter regions. No mass, hemorrhage, edema or other evidence of acute parenchymal abnormality. No extra-axial hemorrhage. Vascular: Chronic calcified atherosclerotic changes of the large vessels at the skull base. No unexpected hyperdense vessel. Skull: Normal. Negative for fracture or focal lesion. Sinuses/Orbits: No acute finding.  Other: Focal soft tissue hematoma overlying the LEFT orbital roof. No underlying fracture seen. CT CERVICAL SPINE FINDINGS Alignment: Minimal anterolisthesis of C6, related to at surrounding degenerative change. Mild dextroscoliosis of the lower cervical spine. No evidence of acute  vertebral body subluxation. Skull base and vertebrae: No fracture line or displaced fracture fragment seen. Facet joints appear normally aligned throughout. Soft tissues and spinal canal: No prevertebral fluid or swelling. No visible canal hematoma. Disc levels: Degenerative spondylosis throughout the cervical spine, mild to moderate in degree. No more than mild central canal stenosis at any level. Upper chest: No acute findings. Other: Bilateral carotid atherosclerosis. IMPRESSION: 1. Focal soft tissue hematoma overlying the LEFT orbital roof. No underlying fracture. 2. No acute intracranial abnormality. No intracranial mass, hemorrhage or edema. 3. No fracture or acute subluxation within the cervical spine. Degenerative changes of the cervical spine, as detailed above. 4. Carotid atherosclerosis. Electronically Signed   By: Franki Cabot M.D.   On: 10/17/2018 12:22   Ct Cervical Spine Wo Contrast  Result Date: 10/17/2018 CLINICAL DATA:  Fall, blunt trauma, on Xarelto. Unwitnessed fall. Abrasions to LEFT shoulder and ribs. EXAM: CT HEAD WITHOUT CONTRAST CT CERVICAL SPINE WITHOUT CONTRAST TECHNIQUE: Multidetector CT imaging of the head and cervical spine was performed following the standard protocol without intravenous contrast. Multiplanar CT image reconstructions of the cervical spine were also generated. COMPARISON:  Head CT dated 09/01/2018. FINDINGS: CT HEAD FINDINGS Brain: Mild generalized age related parenchymal volume loss with commensurate dilatation of the ventricles and sulci. Chronic small vessel ischemic changes again noted within the bilateral periventricular and subcortical white matter regions. No mass, hemorrhage,  edema or other evidence of acute parenchymal abnormality. No extra-axial hemorrhage. Vascular: Chronic calcified atherosclerotic changes of the large vessels at the skull base. No unexpected hyperdense vessel. Skull: Normal. Negative for fracture or focal lesion. Sinuses/Orbits: No acute finding. Other: Focal soft tissue hematoma overlying the LEFT orbital roof. No underlying fracture seen. CT CERVICAL SPINE FINDINGS Alignment: Minimal anterolisthesis of C6, related to at surrounding degenerative change. Mild dextroscoliosis of the lower cervical spine. No evidence of acute vertebral body subluxation. Skull base and vertebrae: No fracture line or displaced fracture fragment seen. Facet joints appear normally aligned throughout. Soft tissues and spinal canal: No prevertebral fluid or swelling. No visible canal hematoma. Disc levels: Degenerative spondylosis throughout the cervical spine, mild to moderate in degree. No more than mild central canal stenosis at any level. Upper chest: No acute findings. Other: Bilateral carotid atherosclerosis. IMPRESSION: 1. Focal soft tissue hematoma overlying the LEFT orbital roof. No underlying fracture. 2. No acute intracranial abnormality. No intracranial mass, hemorrhage or edema. 3. No fracture or acute subluxation within the cervical spine. Degenerative changes of the cervical spine, as detailed above. 4. Carotid atherosclerosis. Electronically Signed   By: Franki Cabot M.D.   On: 10/17/2018 12:22   Dg Chest Port 1 View  Result Date: 10/17/2018 CLINICAL DATA:  Fall. EXAM: PORTABLE CHEST 1 VIEW COMPARISON:  09/10/2018 FINDINGS: Stable cardiac enlargement and aortic atherosclerosis. No pleural effusion or edema. No airspace opacities. Marked thoracic scoliosis and degenerative disc disease. IMPRESSION: 1. No active cardiopulmonary abnormalities. 2.  Aortic Atherosclerosis (ICD10-I70.0). Electronically Signed   By: Kerby Moors M.D.   On: 10/17/2018 11:45    Pending  Labs Unresulted Labs (From admission, onward)    Start     Ordered   10/18/18 [id number]  Basic metabolic panel  Tomorrow morning,   R     10/17/18 1600   10/18/18 0500  CBC  Tomorrow morning,   R     10/17/18 1600          Vitals/Pain Today's Vitals   10/17/18 1358 10/17/18 1400 10/17/18 1500 10/17/18 1530  BP: (!) 125/91 (!) 133/92 (!) 134/91 (!) 135/91  Pulse: (!) 107 (!) 101  71  Resp: 16   16  Temp:      TempSrc:      SpO2: 100% 99%  94%    Isolation Precautions No active isolations  Medications Medications  sodium chloride 0.9 % bolus 500 mL (has no administration in time range)  acetaminophen (TYLENOL) tablet 650 mg (has no administration in time range)    Or  acetaminophen (TYLENOL) suppository 650 mg (has no administration in time range)  sodium chloride 0.225 % with sodium bicarbonate 50 mEq infusion (has no administration in time range)  carvedilol (COREG) tablet 12.5 mg (has no administration in time range)  levothyroxine (SYNTHROID) tablet 50 mcg (has no administration in time range)  Rivaroxaban (XARELTO) tablet 15 mg (has no administration in time range)  vitamin B-12 (CYANOCOBALAMIN) tablet 1,000 mcg (has no administration in time range)  escitalopram (LEXAPRO) tablet 5 mg (has no administration in time range)  cholecalciferol (VITAMIN D3) tablet 1,000 Units (has no administration in time range)    Mobility walks with device High fall risk   Focused Assessments Neuro Assessment Handoff:  Swallow screen pass? Cardiac Rhythm: Atrial fibrillation       Neuro Assessment:   Neuro Checks:      Last Documented NIHSS Modified Score:   Has TPA been given? No If patient is a Neuro Trauma and patient is going to OR before floor call report to [place] nurse: [phone] or [phone]     R Recommendations: See Admitting Provider Note  Report given to:   Additional Notes:

## 2018-10-17 NOTE — ED Notes (Signed)
Faith Holmes [id number] call to give an update

## 2018-10-17 NOTE — ED Provider Notes (Signed)
[facility] EMERGENCY DEPARTMENT Provider Note   CSN: [id number] Arrival date & time: 10/17/18  1035    History   Chief Complaint Chief Complaint  Patient presents with   Fall    unwitnessed fall at home    HPI Faith Holmes is a 85 y.o. female.     85yo female brought in by EMS for injuries from a fall. Patient has a history of a. Fib, on Xarelto, diabetes, not on medications, HTN. Patient states she woke up early this morning and noticed her closet light was on, used her walker to ambulate to the closed to turn off the light and fell. Patient was unable to get back up and eventually made her way to her bed. Patient was found on the floor partly under her bed by family this morning who called 911. Patient reports multiple falls recently without any significant injuries. Reports pain in her hips that is chronic. Denies any other complaints or concerns.      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   Mild aortic stenosis    Thrombocytopenia ([facility])    Thyroid disease    hypothyroidism    Patient Active Problem List   Diagnosis Date Noted   Hyperkalemia 08/23/2018   ARF (acute renal failure) ([place]) 08/23/2018   COVID-19 virus infection 08/23/2018   Hypotension 08/23/2018   Dehydration 08/23/2018   HTN (hypertension) 08/23/2018   A-fib ([place]) 08/23/2018   HLD (hyperlipidemia) 08/23/2018   Hypothyroidism 08/23/2018   Acute on chronic diastolic CHF (congestive heart failure) ([place]) 08/23/2018   Iron deficiency anemia 07/01/2017   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      Home Medications    Prior to Admission medications   Medication Sig Start Date End Date Taking? Authorizing Provider  carvedilol (COREG) 12.5 MG tablet Take 12.5 mg by mouth 2 (two) times daily with a meal.    [provider]  Cholecalciferol (VITAMIN D3) 1000 units CAPS Take 1,000 Units by mouth daily.    [provider]  cyanocobalamin 100 MCG tablet Take 100 mcg by mouth daily.     [provider]  escitalopram (LEXAPRO) 5 MG tablet Take 5 mg by mouth at bedtime.    [provider]  iron polysaccharides (NU-IRON) 150 MG capsule Take 150 mg by mouth at bedtime.     [provider]  levothyroxine (SYNTHROID, LEVOTHROID) 50 MCG tablet Take 50 mcg by mouth daily.    [provider]  mupirocin ointment (BACTROBAN) 2 % Apply 1 application topically at bedtime. 09/06/18   [provider]  Rivaroxaban (XARELTO) 15 MG TABS tablet Take 15 mg by mouth at bedtime.     [provider]    Family History No family history on file.  Social History Social History   Tobacco Use   Smoking status: Never Smoker  Smokeless tobacco: Never Used  Substance Use Topics   Alcohol use: Not Currently   Drug use: Not on file     Allergies   Codeine, Furosemide, Oxaprozin, and Ramipril   Review of Systems Review of Systems  Constitutional: Negative for fever.  Respiratory: Negative for shortness of breath.   Cardiovascular: Negative for chest pain.  Gastrointestinal: Negative for abdominal pain, nausea and vomiting.  Musculoskeletal: Positive for arthralgias and gait problem. Negative for back pain, myalgias and neck pain.       Ambulatory with a walker  Skin: Positive for wound. Negative for rash.  Neurological: Positive for  weakness.  Hematological: Bruises/bleeds easily.  Psychiatric/Behavioral: Negative for confusion.  All other systems reviewed and are negative.    Physical Exam Updated Vital Signs BP (!) 145/82    Pulse 98    Temp 97.6 F (36.4 C) (Oral)    Resp 14    SpO2 95%   Physical Exam Vitals signs and nursing note reviewed.  Constitutional:      General: She is not in acute distress.    Appearance: She is well-developed. She is not diaphoretic.  HENT:     Head: Normocephalic.      Mouth/Throat:     Mouth: Mucous membranes are dry.  Eyes:     Pupils: Pupils are equal, round, and reactive to light.  Neck:     Musculoskeletal: No muscular tenderness.  Cardiovascular:     Rate and Rhythm: Normal rate. Rhythm irregular.     Heart sounds: Normal heart sounds.  Pulmonary:     Effort: Pulmonary effort is normal.     Breath sounds: Normal breath sounds.  Abdominal:     Palpations: Abdomen is soft.     Tenderness: There is no abdominal tenderness.  Musculoskeletal: Normal range of motion.        General: No swelling, tenderness or deformity.     Cervical back: She exhibits no tenderness and no bony tenderness.     Thoracic back: She exhibits no tenderness and no bony tenderness.     Lumbar back: She exhibits no tenderness and no bony tenderness.     Right lower leg: No edema.     Left lower leg: No edema.     Comments: Bruising to left proximal upper arm, no pain with ROM left shoulder. Ecchymosis to bilateral forearm and lower legs without deformity or bony tenderness. Minor skin tear to dorsum of left hand. Small area of ecchymosis to upper back/lower c-spine area  Skin:    General: Skin is warm and dry.     Findings: Bruising present.  Neurological:     Mental Status: She is alert and oriented to person, place, and time.  Psychiatric:        Behavior: Behavior normal.      ED Treatments / Results  Labs (all labs ordered are listed, but only abnormal results are  displayed) Labs Reviewed  COMPREHENSIVE METABOLIC PANEL - Abnormal; Notable for the following components:      Result Value   Sodium 131 (*)    Potassium 5.2 (*)    CO2 16 (*)    Glucose, Bld 161 (*)    BUN 30 (*)    Creatinine, Ser 2.18 (*)    Calcium 8.6 (*)    Total Protein 5.3 (*)    Albumin 3.1 (*)    Total Bilirubin 1.3 (*)    GFR calc non Af Amer 19 (*)    GFR calc  Af Amer 22 (*)    Anion gap 16 (*)    All other components within normal limits  CBC WITH DIFFERENTIAL/PLATELET - Abnormal; Notable for the following components:   RBC 3.08 (*)    Hemoglobin 10.2 (*)    HCT 31.3 (*)    MCV 101.6 (*)    Platelets 103 (*)    Lymphs Abs 0.5 (*)    All other components within normal limits  URINALYSIS, ROUTINE W REFLEX MICROSCOPIC - Abnormal; Notable for the following components:   Ketones, ur 5 (*)    Protein, ur 30 (*)    All other components within normal limits  SARS CORONAVIRUS 2 (HOSPITAL ORDER, [facility])  CK    EKG EKG Interpretation  Date/Time:  Sunday October 17 2018 11:20:29 EDT Ventricular Rate:  109 PR Interval:    QRS Duration: 88 QT Interval:  345 QTC Calculation: 484 R Axis:   -35 Text Interpretation:  Atrial fibrillation Ventricular premature complex Left axis deviation Borderline low voltage, extremity leads Nonspecific repol abnormality, diffuse leads No significant change since last tracing Confirmed by Isla Pence [phone]) on 10/17/2018 11:24:06 AM   Radiology Dg Pelvis 1-2 Views  Result Date: 10/17/2018 CLINICAL DATA:  Weakness. EXAM: PELVIS - 1-2 VIEW COMPARISON:  09/01/2018 FINDINGS: There is no evidence of pelvic fracture or diastasis. No pelvic bone lesions are seen. IMPRESSION: Negative. Electronically Signed   By: Kerby Moors M.D.   On: 10/17/2018 11:40   Ct Head Wo Contrast  Result Date: 10/17/2018 CLINICAL DATA:  Fall, blunt trauma, on Xarelto. Unwitnessed fall. Abrasions to LEFT shoulder and ribs. EXAM:  CT HEAD WITHOUT CONTRAST CT CERVICAL SPINE WITHOUT CONTRAST TECHNIQUE: Multidetector CT imaging of the head and cervical spine was performed following the standard protocol without intravenous contrast. Multiplanar CT image reconstructions of the cervical spine were also generated. COMPARISON:  Head CT dated 09/01/2018. FINDINGS: CT HEAD FINDINGS Brain: Mild generalized age related parenchymal volume loss with commensurate dilatation of the ventricles and sulci. Chronic small vessel ischemic changes again noted within the bilateral periventricular and subcortical white matter regions. No mass, hemorrhage, edema or other evidence of acute parenchymal abnormality. No extra-axial hemorrhage. Vascular: Chronic calcified atherosclerotic changes of the large vessels at the skull base. No unexpected hyperdense vessel. Skull: Normal. Negative for fracture or focal lesion. Sinuses/Orbits: No acute finding. Other: Focal soft tissue hematoma overlying the LEFT orbital roof. No underlying fracture seen. CT CERVICAL SPINE FINDINGS Alignment: Minimal anterolisthesis of C6, related to at surrounding degenerative change. Mild dextroscoliosis of the lower cervical spine. No evidence of acute vertebral body subluxation. Skull base and vertebrae: No fracture line or displaced fracture fragment seen. Facet joints appear normally aligned throughout. Soft tissues and spinal canal: No prevertebral fluid or swelling. No visible canal hematoma. Disc levels: Degenerative spondylosis throughout the cervical spine, mild to moderate in degree. No more than mild central canal stenosis at any level. Upper chest: No acute findings. Other: Bilateral carotid atherosclerosis. IMPRESSION: 1. Focal soft tissue hematoma overlying the LEFT orbital roof. No underlying fracture. 2. No acute intracranial abnormality. No intracranial mass, hemorrhage or edema. 3. No fracture or acute subluxation within the cervical spine. Degenerative changes of the cervical  spine, as detailed above. 4. Carotid atherosclerosis. Electronically Signed   By: Franki Cabot M.D.   On: 10/17/2018 12:22   Ct Cervical Spine Wo Contrast  Result Date: 10/17/2018 CLINICAL DATA:  Fall, blunt trauma, on Xarelto. Unwitnessed fall. Abrasions to LEFT  shoulder and ribs. EXAM: CT HEAD WITHOUT CONTRAST CT CERVICAL SPINE WITHOUT CONTRAST TECHNIQUE: Multidetector CT imaging of the head and cervical spine was performed following the standard protocol without intravenous contrast. Multiplanar CT image reconstructions of the cervical spine were also generated. COMPARISON:  Head CT dated 09/01/2018. FINDINGS: CT HEAD FINDINGS Brain: Mild generalized age related parenchymal volume loss with commensurate dilatation of the ventricles and sulci. Chronic small vessel ischemic changes again noted within the bilateral periventricular and subcortical white matter regions. No mass, hemorrhage, edema or other evidence of acute parenchymal abnormality. No extra-axial hemorrhage. Vascular: Chronic calcified atherosclerotic changes of the large vessels at the skull base. No unexpected hyperdense vessel. Skull: Normal. Negative for fracture or focal lesion. Sinuses/Orbits: No acute finding. Other: Focal soft tissue hematoma overlying the LEFT orbital roof. No underlying fracture seen. CT CERVICAL SPINE FINDINGS Alignment: Minimal anterolisthesis of C6, related to at surrounding degenerative change. Mild dextroscoliosis of the lower cervical spine. No evidence of acute vertebral body subluxation. Skull base and vertebrae: No fracture line or displaced fracture fragment seen. Facet joints appear normally aligned throughout. Soft tissues and spinal canal: No prevertebral fluid or swelling. No visible canal hematoma. Disc levels: Degenerative spondylosis throughout the cervical spine, mild to moderate in degree. No more than mild central canal stenosis at any level. Upper chest: No acute findings. Other: Bilateral carotid  atherosclerosis. IMPRESSION: 1. Focal soft tissue hematoma overlying the LEFT orbital roof. No underlying fracture. 2. No acute intracranial abnormality. No intracranial mass, hemorrhage or edema. 3. No fracture or acute subluxation within the cervical spine. Degenerative changes of the cervical spine, as detailed above. 4. Carotid atherosclerosis. Electronically Signed   By: Franki Cabot M.D.   On: 10/17/2018 12:22   Dg Chest Port 1 View  Result Date: 10/17/2018 CLINICAL DATA:  Fall. EXAM: PORTABLE CHEST 1 VIEW COMPARISON:  09/10/2018 FINDINGS: Stable cardiac enlargement and aortic atherosclerosis. No pleural effusion or edema. No airspace opacities. Marked thoracic scoliosis and degenerative disc disease. IMPRESSION: 1. No active cardiopulmonary abnormalities. 2.  Aortic Atherosclerosis (ICD10-I70.0). Electronically Signed   By: Kerby Moors M.D.   On: 10/17/2018 11:45    Procedures .Critical Care Performed by: Tacy Learn, PA-C Authorized by: Tacy Learn, PA-C   Critical care provider statement:    Critical care time (minutes):  45   Critical care was time spent personally by me on the following activities:  Discussions with consultants, evaluation of patient's response to treatment, examination of patient, ordering and performing treatments and interventions, ordering and review of laboratory studies, ordering and review of radiographic studies, pulse oximetry, re-evaluation of patient's condition, obtaining history from patient or surrogate and review of old charts   (including critical care time)  Medications Ordered in ED Medications  sodium chloride 0.9 % bolus 500 mL (has no administration in time range)     Initial Impression / Assessment and Plan / ED Course  I have reviewed the triage vital signs and the nursing notes.  Pertinent labs & imaging results that were available during my care of the patient were reviewed by me and considered in my medical decision making  (see chart for details).  Clinical Course as of Oct 16 1352  Sun Oct 17, 2018  1323 85yo female brought in by EMS where she lives independently, is visited regularly by her daughter-in-law Faith Holmes.  Patient fell sometime in the night, was found by her daughter-in-law lying on the floor halfway under the bed today.  Patient is  on Xarelto for A. fib.  Patient reports pain in her hips but states this is not new for her.  Patient is found to have bruising to the left side of her face with a small laceration to left lateral eyebrow.  She has bruising to her forearms and lower leg with a small skin tear to the dorsum of her left hand.  She does not have any bony tenderness.  She has mild pain with range of motion of both of her hips.  CT of the head and C-spine without acute findings, x-ray of the pelvis is unremarkable.    [LM]  1324 MCH: 33.1 [WF]  1325 Review of lab work, CK is in the normal limits, CBC patient at baseline anemia with hemoglobin of 10.2.  CMP with concern for acute kidney injury with creatinine elevated to 2.18, Co2 16, gap 16. K mildly elevated at 5.2.  Care discussed with Dr. Gilford Raid, ER attending who has seen the patient and agrees with plan for admission.  UA pending. Results discussed with patient's daughter Faith Holmes- states patient was treated for a UTI about a month ago and seemed to be doing better, concern for return of UTI, states patient's urine has been dark lately.  Also reports patient was seen by her doctor for pain in her hip, diagnosed with a strain and prescribed tramadol. Consider Tramadol as possible cause of patient's more frequent falls.  Patient sees [facility] PCP, hospitalist for unassigned patients paged for consult.   [LM]  [id number] Case discussed with hospitalist with the teaching service who will consult for admission.    [LM]    Clinical Course User Index [LM] Tacy Learn, PA-C [WF] Jerel Shepherd      Final Clinical Impressions(s) / ED  Diagnoses   Final diagnoses:  Fall, initial encounter  AKI (acute kidney injury) [facility])  Contusion of face, initial encounter    ED Discharge Orders    None       Roque Lias 10/17/18 1354    Isla Pence, MD 10/17/18 1407

## 2018-10-17 NOTE — Progress Notes (Signed)
NEW ADMISSION NOTE New Admission Note:   Arrival Method: stretcher from ED Mental Orientation: alert and oriented x4  Assessment: Completed Skin: bruising all over body IV: RFA fluids running (bolus) Pain: 2- hip pain Right side  Safety Measures: Safety Fall Prevention Plan has been given, discussed and signed Admission: Completed [facility] Orientation: Patient has been orientated to the room, unit and staff.  Family: NA  Orders have been reviewed and implemented. Will continue to monitor the patient. Call light has been placed within reach and bed alarm has been activated.   Baldo Ash, RN

## 2018-10-18 DIAGNOSIS — Z20828 Contact with and (suspected) exposure to other viral communicable diseases: Secondary | ICD-10-CM | POA: Diagnosis present

## 2018-10-18 DIAGNOSIS — D696 Thrombocytopenia, unspecified: Secondary | ICD-10-CM | POA: Diagnosis present

## 2018-10-18 DIAGNOSIS — Z79899 Other long term (current) drug therapy: Secondary | ICD-10-CM

## 2018-10-18 DIAGNOSIS — I129 Hypertensive chronic kidney disease with stage 1 through stage 4 chronic kidney disease, or unspecified chronic kidney disease: Secondary | ICD-10-CM

## 2018-10-18 DIAGNOSIS — H9190 Unspecified hearing loss, unspecified ear: Secondary | ICD-10-CM | POA: Diagnosis present

## 2018-10-18 DIAGNOSIS — E871 Hypo-osmolality and hyponatremia: Secondary | ICD-10-CM

## 2018-10-18 DIAGNOSIS — I130 Hypertensive heart and chronic kidney disease with heart failure and stage 1 through stage 4 chronic kidney disease, or unspecified chronic kidney disease: Secondary | ICD-10-CM | POA: Diagnosis present

## 2018-10-18 DIAGNOSIS — E039 Hypothyroidism, unspecified: Secondary | ICD-10-CM | POA: Diagnosis present

## 2018-10-18 DIAGNOSIS — E7800 Pure hypercholesterolemia, unspecified: Secondary | ICD-10-CM | POA: Diagnosis present

## 2018-10-18 DIAGNOSIS — Z87440 Personal history of urinary (tract) infections: Secondary | ICD-10-CM

## 2018-10-18 DIAGNOSIS — W19XXXA Unspecified fall, initial encounter: Secondary | ICD-10-CM | POA: Diagnosis present

## 2018-10-18 DIAGNOSIS — E46 Unspecified protein-calorie malnutrition: Secondary | ICD-10-CM | POA: Diagnosis present

## 2018-10-18 DIAGNOSIS — Z66 Do not resuscitate: Secondary | ICD-10-CM | POA: Diagnosis present

## 2018-10-18 DIAGNOSIS — R296 Repeated falls: Secondary | ICD-10-CM | POA: Diagnosis present

## 2018-10-18 DIAGNOSIS — D509 Iron deficiency anemia, unspecified: Secondary | ICD-10-CM

## 2018-10-18 DIAGNOSIS — Z7901 Long term (current) use of anticoagulants: Secondary | ICD-10-CM

## 2018-10-18 DIAGNOSIS — N189 Chronic kidney disease, unspecified: Secondary | ICD-10-CM | POA: Diagnosis not present

## 2018-10-18 DIAGNOSIS — Z6820 Body mass index (BMI) 20.0-20.9, adult: Secondary | ICD-10-CM | POA: Diagnosis not present

## 2018-10-18 DIAGNOSIS — I5032 Chronic diastolic (congestive) heart failure: Secondary | ICD-10-CM | POA: Diagnosis present

## 2018-10-18 DIAGNOSIS — Z79890 Hormone replacement therapy (postmenopausal): Secondary | ICD-10-CM

## 2018-10-18 DIAGNOSIS — D631 Anemia in chronic kidney disease: Secondary | ICD-10-CM

## 2018-10-18 DIAGNOSIS — N184 Chronic kidney disease, stage 4 (severe): Secondary | ICD-10-CM | POA: Diagnosis present

## 2018-10-18 DIAGNOSIS — Z9181 History of falling: Secondary | ICD-10-CM

## 2018-10-18 DIAGNOSIS — F39 Unspecified mood [affective] disorder: Secondary | ICD-10-CM

## 2018-10-18 DIAGNOSIS — E1122 Type 2 diabetes mellitus with diabetic chronic kidney disease: Secondary | ICD-10-CM | POA: Diagnosis present

## 2018-10-18 DIAGNOSIS — I4819 Other persistent atrial fibrillation: Secondary | ICD-10-CM | POA: Diagnosis present

## 2018-10-18 DIAGNOSIS — N179 Acute kidney failure, unspecified: Principal | ICD-10-CM

## 2018-10-18 DIAGNOSIS — M25551 Pain in right hip: Secondary | ICD-10-CM | POA: Diagnosis present

## 2018-10-18 DIAGNOSIS — F05 Delirium due to known physiological condition: Secondary | ICD-10-CM | POA: Diagnosis present

## 2018-10-18 DIAGNOSIS — G8929 Other chronic pain: Secondary | ICD-10-CM | POA: Diagnosis present

## 2018-10-18 DIAGNOSIS — S0083XA Contusion of other part of head, initial encounter: Secondary | ICD-10-CM | POA: Diagnosis present

## 2018-10-18 DIAGNOSIS — I959 Hypotension, unspecified: Secondary | ICD-10-CM | POA: Diagnosis not present

## 2018-10-18 DIAGNOSIS — I4891 Unspecified atrial fibrillation: Secondary | ICD-10-CM

## 2018-10-18 DIAGNOSIS — E538 Deficiency of other specified B group vitamins: Secondary | ICD-10-CM | POA: Diagnosis present

## 2018-10-18 DIAGNOSIS — E079 Disorder of thyroid, unspecified: Secondary | ICD-10-CM | POA: Diagnosis present

## 2018-10-18 DIAGNOSIS — Y92009 Unspecified place in unspecified non-institutional (private) residence as the place of occurrence of the external cause: Secondary | ICD-10-CM | POA: Diagnosis not present

## 2018-10-18 DIAGNOSIS — E860 Dehydration: Secondary | ICD-10-CM | POA: Diagnosis present

## 2018-10-18 LAB — HEPATIC FUNCTION PANEL
ALT: 17 U/L (ref 0–44)
AST: 25 U/L (ref 15–41)
Albumin: 3 g/dL — ABNORMAL LOW (ref 3.5–5.0)
Alkaline Phosphatase: 66 U/L (ref 38–126)
Bilirubin, Direct: 0.3 mg/dL — ABNORMAL HIGH (ref 0.0–0.2)
Indirect Bilirubin: 0.7 mg/dL (ref 0.3–0.9)
Total Bilirubin: 1 mg/dL (ref 0.3–1.2)
Total Protein: 5.2 g/dL — ABNORMAL LOW (ref 6.5–8.1)

## 2018-10-18 LAB — BASIC METABOLIC PANEL
Anion gap: 11 (ref 5–15)
Anion gap: 9 (ref 5–15)
BUN: 32 mg/dL — ABNORMAL HIGH (ref 8–23)
BUN: 35 mg/dL — ABNORMAL HIGH (ref 8–23)
CO2: 21 mmol/L — ABNORMAL LOW (ref 22–32)
CO2: 22 mmol/L (ref 22–32)
Calcium: 8.3 mg/dL — ABNORMAL LOW (ref 8.9–10.3)
Calcium: 8.3 mg/dL — ABNORMAL LOW (ref 8.9–10.3)
Chloride: 101 mmol/L (ref 98–111)
Chloride: 98 mmol/L (ref 98–111)
Creatinine, Ser: 2.07 mg/dL — ABNORMAL HIGH (ref 0.44–1.00)
Creatinine, Ser: 1.93 mg/dL — ABNORMAL HIGH (ref 0.44–1.00)
GFR calc Af Amer: 24 mL/min — ABNORMAL LOW (ref >60)
GFR calc Af Amer: 26 mL/min — ABNORMAL LOW (ref >60)
GFR calc non Af Amer: 21 mL/min — ABNORMAL LOW (ref >60)
GFR calc non Af Amer: 22 mL/min — ABNORMAL LOW (ref >60)
Glucose, Bld: 70 mg/dL (ref 70–99)
Glucose, Bld: 87 mg/dL (ref 70–99)
Potassium: 4.6 mmol/L (ref 3.5–5.1)
Potassium: 4.5 mmol/L (ref 3.5–5.1)
Sodium: 133 mmol/L — ABNORMAL LOW (ref 135–145)
Sodium: 129 mmol/L — ABNORMAL LOW (ref 135–145)

## 2018-10-18 LAB — CBC
HCT: 24.7 % — ABNORMAL LOW (ref 36.0–46.0)
HCT: 23.2 % — ABNORMAL LOW (ref 36.0–46.0)
Hemoglobin: 7.8 g/dL — ABNORMAL LOW (ref 12.0–15.0)
Hemoglobin: 7.4 g/dL — ABNORMAL LOW (ref 12.0–15.0)
MCH: 32.5 pg (ref 26.0–34.0)
MCH: 32.7 pg (ref 26.0–34.0)
MCHC: 31.6 g/dL (ref 30.0–36.0)
MCHC: 31.9 g/dL (ref 30.0–36.0)
MCV: 102.9 fL — ABNORMAL HIGH (ref 80.0–100.0)
MCV: 102.7 fL — ABNORMAL HIGH (ref 80.0–100.0)
Platelets: 106 10*3/uL — ABNORMAL LOW (ref 150–400)
Platelets: 102 10*3/uL — ABNORMAL LOW (ref 150–400)
RBC: 2.4 MIL/uL — ABNORMAL LOW (ref 3.87–5.11)
RBC: 2.26 MIL/uL — ABNORMAL LOW (ref 3.87–5.11)
RDW: 15.5 % (ref 11.5–15.5)
RDW: 15.7 % — ABNORMAL HIGH (ref 11.5–15.5)
WBC: 5.8 10*3/uL (ref 4.0–10.5)
WBC: 4.3 10*3/uL (ref 4.0–10.5)
nRBC: 0 % (ref 0.0–0.2)
nRBC: 0 % (ref 0.0–0.2)

## 2018-10-18 LAB — FOLATE: Folate: 4.1 ng/mL — ABNORMAL LOW (ref >5.9)

## 2018-10-18 LAB — GLUCOSE, CAPILLARY
Glucose-Capillary: 87 mg/dL (ref 70–99)
Glucose-Capillary: 90 mg/dL (ref 70–99)
Glucose-Capillary: 93 mg/dL (ref 70–99)

## 2018-10-18 LAB — FERRITIN: Ferritin: 558 ng/mL — ABNORMAL HIGH (ref 11–307)

## 2018-10-18 LAB — RETICULOCYTES
Immature Retic Fract: 18.9 % — ABNORMAL HIGH (ref 2.3–15.9)
RBC.: 2.41 MIL/uL — ABNORMAL LOW (ref 3.87–5.11)
Retic Count, Absolute: 68.1 10*3/uL (ref 19.0–186.0)
Retic Ct Pct: 2.8 % (ref 0.4–3.1)

## 2018-10-18 LAB — LACTATE DEHYDROGENASE: LDH: 256 U/L — ABNORMAL HIGH (ref 98–192)

## 2018-10-18 LAB — OSMOLALITY: Osmolality: 279 mOsm/kg (ref 275–295)

## 2018-10-18 LAB — SAVE SMEAR(SSMR), FOR PROVIDER SLIDE REVIEW

## 2018-10-18 LAB — TSH: TSH: 3.627 u[IU]/mL (ref 0.350–4.500)

## 2018-10-18 MED ORDER — FOLIC ACID 5 MG/ML IJ SOLN
1.0000 mg | Freq: Every day | INTRAMUSCULAR | Status: DC
  Administered 2018-10-18 – 2018-10-19 (×2): 1 mg via INTRAVENOUS
  Filled 2018-10-18 (×3): qty 0.2

## 2018-10-18 MED ORDER — PRO-STAT SUGAR FREE PO LIQD
30.0000 mL | Freq: Two times a day (BID) | ORAL | Status: DC
  Administered 2018-10-18 – 2018-10-24 (×13): 30 mL via ORAL
  Filled 2018-10-18 (×13): qty 30

## 2018-10-18 MED ORDER — LACTATED RINGERS IV SOLN
INTRAVENOUS | Status: AC
  Administered 2018-10-18: 12:00:00 via INTRAVENOUS

## 2018-10-18 MED ORDER — SODIUM CHLORIDE 0.9 % IV SOLN: Freq: Once | INTRAVENOUS | Status: DC

## 2018-10-18 MED ORDER — APIXABAN 2.5 MG PO TABS
2.5000 mg | ORAL_TABLET | Freq: Two times a day (BID) | ORAL | Status: DC
  Administered 2018-10-19 – 2018-10-24 (×11): 2.5 mg via ORAL
  Filled 2018-10-18 (×11): qty 1

## 2018-10-18 NOTE — Progress Notes (Signed)
Patient unable to stand for  A short period of time. Unable to complete Orthostatic standing.

## 2018-10-18 NOTE — Evaluation (Signed)
Physical Therapy Evaluation Patient Details Name: Faith Holmes MRN: [id number] DOB: 10-06-1926 Today's Date: 10/18/2018   History of Present Illness  85 y.o. female presenting after unobserved fall. Daughter-in-law states she found patient lying on the floor near her bed in a conscious state. In the ED, CT Head & Cervical Spine showed focal soft tissue hematoma overlying the LEFT orbital roof. No underlying fracture, acute intracranial abnormality, or hemorrhage. No fracture of cervical spine. COVID test negative. PMH including HTN, HLD, DM II (has not been on any oral medication) diet-controlled, Afib, dCHF, mild aortic stenosis, arthritis, COVID (in June), and Chronic iron deficiency.   Clinical Impression   Pt admitted with above diagnosis. Unclear prior level of function, as pt was not a clear historian; Presents with decr activity tolerance, functional dependence; Low BPs to begin with, and with significant dropin BP coming from supine to sit; see below or See vitals flow sheet.   Pt currently with functional limitations due to the deficits listed below (see PT Problem List). Pt will benefit from skilled PT to increase their independence and safety with mobility to allow discharge to the venue listed below.       Follow Up Recommendations SNF    Equipment Recommendations  Rolling walker with 5" wheels;3in1 (PT)    Recommendations for Other Services       Precautions / Restrictions Precautions Precautions: Fall Precaution Comments: very low BPs on PT eval      Mobility  Bed Mobility Overal bed mobility: Needs Assistance Bed Mobility: Sit to Supine       Sit to supine: Min guard   General bed mobility comments: minguard for safety and VCs to center self in teh bed  Transfers Overall transfer level: Needs assistance Equipment used: 1 person hand held assist Transfers: Sit to/from [facility] Sit to Stand: Mod assist Stand pivot transfers: Mod assist        General transfer comment: Mod A for power up into standing and to prevent posterior lean. Pt then requiring Min A for balance during pivot back to bed  Ambulation/Gait                Stairs            Wheelchair Mobility    Modified Rankin (Stroke Patients Only)       Balance     Sitting balance-Leahy Scale: Fair       Standing balance-Leahy Scale: Poor Standing balance comment: Reliant on UE support and physical A                             Pertinent Vitals/Pain Pain Assessment: Faces Faces Pain Scale: No hurt Pain Intervention(s): Monitored during session    Home Living Family/patient expects to be discharged to:: Private residence Living Arrangements: Children(will stay with daugher) Available Help at Discharge: Family;Available 24 hours/day Type of Home: House Home Access: Stairs to enter   [employer] of Steps: 2 Home Layout: One level Home Equipment: Walker - 2 wheels Additional Comments: Poor historian. Pt reporting she lives with her mother, father, and brother    Prior Function Level of Independence: Needs assistance   Gait / Transfers Assistance Needed: no device inside; holds onto arm of daughter if goes outside home  ADL's / Homemaking Assistance Needed: does not drive; family assists with groceries  Comments: Poor historian. Reports she is still working at [employer] When presented with RW, pt reports  she "uses one of those"     Hand Dominance   Dominant Hand: Right    Extremity/Trunk Assessment   Upper Extremity Assessment Upper Extremity Assessment: Defer to OT evaluation    Lower Extremity Assessment Lower Extremity Assessment: Generalized weakness    Cervical / Trunk Assessment Cervical / Trunk Assessment: Kyphotic  Communication   Communication: HOH  Cognition Arousal/Alertness: Awake/alert Behavior During Therapy: WFL for tasks assessed/performed Overall Cognitive Status:  Impaired/Different from baseline Area of Impairment: Orientation;Attention;Memory;Following commands;Safety/judgement;Awareness;Problem solving                 Orientation Level: Disoriented to;Place;Time;Situation Current Attention Level: Sustained Memory: Decreased short-term memory;Decreased recall of precautions Following Commands: Follows one step commands inconsistently;Follows one step commands with increased time Safety/Judgement: Decreased awareness of safety;Decreased awareness of deficits Awareness: Intellectual Problem Solving: Slow processing;Decreased initiation;Requires verbal cues;Requires tactile cues General Comments: Pt only oriented to self. Reports she lives with her family. Pleasant and agreeable to OOB activity. Pt perseverating on topic of paying a bill.       General Comments General comments (skin integrity, edema, etc.): Very low BPs during session:   10/18/18 1446 10/18/18 1455  Orthostatic Lying   BP- Lying (!) 84/69 96/73  Pulse- Lying 88 93  Orthostatic Sitting  BP- Sitting (!) 55/22  --   Pulse- Sitting 83  --        Exercises     Assessment/Plan    PT Assessment Patient needs continued PT services  PT Problem List Decreased strength;Decreased activity tolerance;Decreased balance;Decreased mobility;Decreased coordination;Decreased cognition;Decreased knowledge of use of DME;Decreased safety awareness;Decreased knowledge of precautions       PT Treatment Interventions DME instruction;Gait training;Functional mobility training;Therapeutic activities;Therapeutic exercise;Balance training;Neuromuscular re-education;Cognitive remediation;Patient/family education    PT Goals (Current goals can be found in the Care Plan section)  Acute Rehab PT Goals Patient Stated Goal: Unstated PT Goal Formulation: Patient unable to participate in goal setting Time For Goal Achievement: 11/01/18 Potential to Achieve Goals: Fair    Frequency Min 3X/week    Barriers to discharge        Co-evaluation               AM-PAC PT "6 Clicks" Mobility  Outcome Measure Help needed turning from your back to your side while in a flat bed without using bedrails?: None Help needed moving from lying on your back to sitting on the side of a flat bed without using bedrails?: A Little Help needed moving to and from a bed to a chair (including a wheelchair)?: A Little Help needed standing up from a chair using your arms (e.g., wheelchair or bedside chair)?: A Little Help needed to walk in hospital room?: A Lot Help needed climbing 3-5 steps with a railing? : A Lot 6 Click Score: 17    End of Session   Activity Tolerance: Other (comment)(limited by decr BP) Patient left: in bed;with call bell/phone within reach Nurse Communication: Mobility status(low BPs) PT Visit Diagnosis: Other abnormalities of gait and mobility (R26.89)    Time: [id number] PT Time Calculation (min) (ACUTE ONLY): 14 min   Charges:   PT Evaluation $PT Eval Moderate Complexity: [place] Pager [phone] Office [place] 10/18/2018, 4:42 PM

## 2018-10-18 NOTE — Plan of Care (Signed)
  Problem: Education: Goal: Knowledge of General Education information will improve Description: Including pain rating scale, medication(s)/side effects and non-pharmacologic comfort measures Outcome: Progressing   Problem: Clinical Measurements: Goal: Will remain free from infection Outcome: Progressing   Problem: Activity: Goal: Risk for activity intolerance will decrease Outcome: Progressing   

## 2018-10-18 NOTE — Evaluation (Signed)
Occupational Therapy Evaluation Patient Details Name: Faith Holmes MRN: [id number] DOB: August 05, 1926 Today's Date: 10/18/2018    History of Present Illness 85 y.o. female presenting after unobserved fall. Daughter-in-law states she found patient lying on the floor near her bed in a conscious state. In the ED, CT Head & Cervical Spine showed focal soft tissue hematoma overlying the LEFT orbital roof. No underlying fracture, acute intracranial abnormality, or hemorrhage. No fracture of cervical spine. COVID test negative. PMH including HTN, HLD, DM II (has not been on any oral medication) diet-controlled, Afib, dCHF, mild aortic stenosis, arthritis, COVID (in June), and Chronic iron deficiency.    Clinical Impression   Upon arrival, pt awake and supine in bed. Pt poor historian reporting that she lives with her mother, father, and brother and works at [employer] Pt requiring Min A for UB ADLs, Mod-Max A for LB ADLs, and Mod A for functional transfers with RW. Pt presenting with poor cognition, strength, balance, and activity tolerance. Pt with low BP (see chart below) during activity and presents with no symptoms of dizziness; notified RN. Pt would benefit from further acute OT to facilitate safe dc. Recommend dc to SNF for further OT to optimize safety, independence with ADLs, and return to PLOF.   Orthostatic BPs   Supine 97/60  Sitting in recliner after transfer 79/57  Seated with legs reclined 105/57        Follow Up Recommendations  SNF;Supervision/Assistance - 24 hour    Equipment Recommendations  Other (comment)(Defer to next venue)    Recommendations for Other Services PT consult     Precautions / Restrictions Precautions Precautions: Fall      Mobility Bed Mobility Overal bed mobility: Needs Assistance Bed Mobility: Supine to Sit     Supine to sit: Min assist;HOB elevated     General bed mobility comments: Pt using bed rails to pull herself towards EOB. Able to bring  BLEs over EOB and elevate trunk. Requiring Min A to bring hips towards EOB  Transfers Overall transfer level: Needs assistance Equipment used: Rolling walker (2 wheeled) Transfers: Sit to/from [facility] Sit to Stand: Mod assist Stand pivot transfers: Min assist       General transfer comment: Mod A for power up into standing and to prevent posterior lean. Pt then requiring Min A for balance during pivotto recliner.     Balance Overall balance assessment: Needs assistance Sitting-balance support: No upper extremity supported;Feet supported Sitting balance-Leahy Scale: Fair     Standing balance support: Bilateral upper extremity supported;During functional activity Standing balance-Leahy Scale: Poor Standing balance comment: Reliant on UE support and physical A                           ADL either performed or assessed with clinical judgement   ADL Overall ADL's : Needs assistance/impaired Eating/Feeding: Set up;Sitting   Grooming: Min guard;Sitting;Cueing for sequencing   Upper Body Bathing: Minimal assistance;Sitting   Lower Body Bathing: Moderate assistance;Sit to/from stand   Upper Body Dressing : Minimal assistance;Sitting   Lower Body Dressing: Maximal assistance;Sit to/from stand Lower Body Dressing Details (indicate cue type and reason): Max A to don socks at bed level Toilet Transfer: Minimal assistance;Moderate assistance;Stand-pivot;RW Toilet Transfer Details (indicate cue type and reason): Mod A for power up into standing and prevent posterior lean. Pt requiring Min A for balance to pivot to recliner         Functional mobility during ADLs:  Moderate assistance;Rolling walker General ADL Comments: Pt presenting with poor cognition, strength, balance, and activity tolerance.      Vision         Perception     Praxis      Pertinent Vitals/Pain Pain Assessment: Faces Faces Pain Scale: No hurt Pain Intervention(s):  Monitored during session     Hand Dominance Right   Extremity/Trunk Assessment Upper Extremity Assessment Upper Extremity Assessment: Generalized weakness   Lower Extremity Assessment Lower Extremity Assessment: Defer to PT evaluation   Cervical / Trunk Assessment Cervical / Trunk Assessment: Kyphotic   Communication Communication Communication: HOH   Cognition Arousal/Alertness: Awake/alert Behavior During Therapy: WFL for tasks assessed/performed Overall Cognitive Status: Impaired/Different from baseline Area of Impairment: Orientation;Attention;Memory;Following commands;Safety/judgement;Awareness;Problem solving                 Orientation Level: Disoriented to;Place;Time;Situation Current Attention Level: Sustained Memory: Decreased short-term memory;Decreased recall of precautions Following Commands: Follows one step commands inconsistently;Follows one step commands with increased time Safety/Judgement: Decreased awareness of safety;Decreased awareness of deficits Awareness: Intellectual Problem Solving: Slow processing;Decreased initiation;Requires verbal cues;Requires tactile cues General Comments: Pt only oriented to self. Reports she lives with her family. Pleasant and agreeable to OOB activity. Pt perseverating on topic of paying a bill.    General Comments  Decreased BP without symptoms. BP supine in bed 97/60; notified RN. BP sitting after transfer to recliner 79/57. BP in recliner with BLE elevated and reclined back 105/57    Exercises     Shoulder Instructions      Home Living Family/patient expects to be discharged to:: Private residence                                 Additional Comments: Poor historian. Pt reporting she lives with her mother, father, and brother      Prior Functioning/Environment Level of Independence: Needs assistance        Comments: Poor historian. Reports she is still working at [employer] When presented with  RW, pt reports she "uses one of those"        OT Problem List: Decreased strength;Decreased range of motion;Decreased activity tolerance;Impaired balance (sitting and/or standing);Decreased knowledge of use of DME or AE;Decreased knowledge of precautions;Decreased cognition;Decreased safety awareness      OT Treatment/Interventions: Self-care/ADL training;Therapeutic exercise;Energy conservation;DME and/or AE instruction;Therapeutic activities;Patient/family education    OT Goals(Current goals can be found in the care plan section) Acute Rehab OT Goals Patient Stated Goal: Unstated OT Goal Formulation: With patient Time For Goal Achievement: 11/01/18 Potential to Achieve Goals: Good  OT Frequency: Min 2X/week   Barriers to D/C:            Co-evaluation              AM-PAC OT "6 Clicks" Daily Activity     Outcome Measure Help from another person eating meals?: A Little Help from another person taking care of personal grooming?: A Little Help from another person toileting, which includes using toliet, bedpan, or urinal?: A Lot Help from another person bathing (including washing, rinsing, drying)?: A Lot Help from another person to put on and taking off regular upper body clothing?: A Little Help from another person to put on and taking off regular lower body clothing?: A Lot 6 Click Score: 15   End of Session Equipment Utilized During Treatment: Rolling walker Nurse Communication: Mobility status;Other (comment)(BP and alarm)  Activity Tolerance:  Patient tolerated treatment well Patient left: in chair;with call bell/phone within reach(Alarm pad in place but no box)  OT Visit Diagnosis: Unsteadiness on feet (R26.81);Other abnormalities of gait and mobility (R26.89);Muscle weakness (generalized) (M62.81);History of falling (Z91.81);Repeated falls (R29.6);Other symptoms and signs involving cognitive function                Time: 0943-1006 OT Time Calculation (min): 23  min Charges:  OT General Charges $OT Visit: 1 Visit OT Evaluation $OT Eval Moderate Complexity: 1 Mod OT Treatments $Self Care/Home Management : 8-22 mins  Marrian Bells MSOT, OTR/L Acute Rehab Pager: [phone] Office: [place] 10/18/2018, 10:49 AM

## 2018-10-18 NOTE — Progress Notes (Addendum)
Subjective: Faith Holmes was interviewed by bedside. She is a very pleasant lady who is quite hard of hearing. She was NOT oriented to place and believes she is at her church right now. Does not report any acute pain and tries to get out of the bed on her own. Asked when she might go home and states she feels better at home. Not agitated.   Objective:  Vital signs in last 24 hours: Vitals:   10/18/18 0430 10/18/18 0920 10/18/18 1200 10/18/18 1230  BP: 106/69 118/73 97/75 (!) 109/56  Pulse: 68 72 87 70  Resp: 18 18  16   Temp: 98.6 F (37 C) 98.2 F (36.8 C) 98.6 F (37 C) 98.5 F (36.9 C)  TempSrc: Oral Oral Oral Oral  SpO2: 94% 100% 98% 98%   Weight change:   Intake/Output Summary (Last 24 hours) at 10/18/2018 1312 Last data filed at 10/18/2018 0920 Gross per 24 hour  Intake 1530.1 ml  Output 0 ml  Net 1530.1 ml   Physical Exam Constitutional:      General: She is not in acute distress.    Appearance: She is not ill-appearing, toxic-appearing or diaphoretic.     NOT oriented to place. HENT:     Head: Normocephalic and atraumatic.  Eyes:     Extraocular Movements: Extraocular movements intact.  Cardiovascular:     Rate and Rhythm: Normal rate.     Pulses: Normal pulses.     Heart sounds: Normal heart sounds.  Pulmonary:     Effort: Pulmonary effort is normal.  Musculoskeletal: Normal range of motion.  Skin:    General: Skin is dry.     Findings: Bruising present.     Comments: Echymosis around left eye. Diffuse bruising over body.  Neurological:     General: No focal deficit present.  Psychiatric:        Behavior: Behavior normal. Insight and Judgement: Limited.  Assessment/Plan: [name] with medical history significant for multiple recent falls, Iron, & EPO Deficiency Anemia, AFIb on Xarelto, Hypothyroidism, HTN, and Hearing Loss who presents with recurrent falls in setting of chronic poor appetite and polypharmacy. Was treated for acute cystitis one month ago  and her UA is neg for nitrites and LEs. WBC 7.5 and patient was afebrile in the ED.   Acute Kidney Injury:  Likely multifactorial etiology in setting of chronic poor PO intake and nephrotoxic drugs particularly given her advanced age, causing a mixed AKI picture (prerenal azotemia and ATN). Notably, she completed a course of Cefdinir 300mg  BID for ten days that ended on 09/21/18 for cystitis, chronic regular use of HCTZ and chronic poor oral intake. In the ED, Cr of 2.18 (Cr was 1.38 in July 2020), BUN of 30, and Bicarb of 16. Bun:Cr ratio of ~14. Cr this AM is 2.07 and BUN 32 despite a bolus of 596ml NS and Na-HC03 50 mEq w 1/4 NS @100ml /hr. This may represent her new baseline. But we'll continue non-invasive workup to rule out other etiologies -IVF: Start gentle hydration with LR @ 175ml/hr -Avoid nephrotoxins including NO NSAIDS or HCTZ -Strict I/Os -Recheck CBC and BMP this PM -Pending Labs: LDH, Retics, Una, UOsm,    Multiple Falls: Likely due to chronic dehydration and being on multiple BP lowering meds including HCTZ. Per daughter-in-law, has low BP in 105/60s @ home. Became hypotensive today working with OT. -PT/OT evaluation for functional status and outpatient needs -Per OT, SNF recommended -SW consult for SNF placement -Discontinue home HCTZ  and Tramadol -Check Orthostatic Blood Pressure tomorrow  -Continue home Vit D, B12, and Iron -Voltaren Gel 2g QID for Chronic R Hip Pain -PRN Tylenol    AFIB:  -Continue rate control with home Coreg 12.5 mg BID for now, may need to decrease dose if intermittent hypotension continues -EF 55-60% back in 2009.  -Was anticoagulated with Xarelto at home. We did NOT resume Xarelto due to recent fall and diffuse echymosis -Will start on Eliquis 2.5mg  BID tomorrow -Monitor on Cardiac Telemetry  Acute on Chronic Anemia: In ED, pt @ her baseline Hgb of 10.2 and MCV 101.6. Multiple chronic nutritional deficiencies and patient regularly takes Iron and  B12 at home. Ferritin elevated at 558, TIBC low and Tsat normal, folate low at 4.1, consistent with folate deficiency but no current iron deficiency.  - Hgb this AM dropped to 7.8. CT Head/Cervical in the ED yesterday were not concerning for any acute hemorrhage other than focal soft tissue hematoma overlying the LEFT orbital roof. No underlying fractures.  -Check LDH and Retics -Will continue gentle hydration with LR 125ml/hr -Repeat CBC and BMP  -Continue PO home iron, B12, and Adding Folate supplements  Thrombocytopenia: mild, appears to be chronic, unchanged. Unclear etiology, possible dx include nutritional deficiency (folate), liver dz, myelodysplastic syndrome, MM. Normal SPEP last year. Will check smear, hold off on further workup given age and comorbidities.  Mild Hyponatremia: Na of 131 in setting of HCTZ use. -Na this AM has improved to 133.  -Continue holding HCTZ -Gentle hydration and encourage PO intake -Pending Labs: Serum Osm, Urine Osm, Urine Na -Trend BMP    History of HTN: normotensive right now -Holding home meds   History of Hypothyroidism: TSH 3.62 -Continue home synthroid 35mcg   Mood Disorder: Continue home Lexapro 5mg    VTE PPX: SCD (will start Eliquis 2.5mg  BID tomorrow)  DISPOSITION: Continue workup of medical conditions and possible SNF placement    LOS: 0 days   Faith Holmes, Medical Student 10/18/2018, 1:12 PM

## 2018-10-18 NOTE — Plan of Care (Signed)
  Problem: Education: Goal: Knowledge of General Education information will improve Description: Including pain rating scale, medication(s)/side effects and non-pharmacologic comfort measures Outcome: Progressing   Problem: Activity: Goal: Risk for activity intolerance will decrease Outcome: Progressing   Problem: Nutrition: Goal: Adequate nutrition will be maintained Outcome: Progressing   

## 2018-10-18 NOTE — Discharge Instructions (Addendum)

## 2018-10-19 DIAGNOSIS — I4819 Other persistent atrial fibrillation: Secondary | ICD-10-CM

## 2018-10-19 DIAGNOSIS — E46 Unspecified protein-calorie malnutrition: Secondary | ICD-10-CM

## 2018-10-19 DIAGNOSIS — N184 Chronic kidney disease, stage 4 (severe): Secondary | ICD-10-CM

## 2018-10-19 LAB — PATHOLOGIST SMEAR REVIEW

## 2018-10-19 LAB — BASIC METABOLIC PANEL
Anion gap: 11 (ref 5–15)
BUN: 37 mg/dL — ABNORMAL HIGH (ref 8–23)
CO2: 22 mmol/L (ref 22–32)
Calcium: 8.3 mg/dL — ABNORMAL LOW (ref 8.9–10.3)
Chloride: 97 mmol/L — ABNORMAL LOW (ref 98–111)
Creatinine, Ser: 1.74 mg/dL — ABNORMAL HIGH (ref 0.44–1.00)
GFR calc Af Amer: 29 mL/min — ABNORMAL LOW (ref >60)
GFR calc non Af Amer: 25 mL/min — ABNORMAL LOW (ref >60)
Glucose, Bld: 70 mg/dL (ref 70–99)
Potassium: 4.4 mmol/L (ref 3.5–5.1)
Sodium: 130 mmol/L — ABNORMAL LOW (ref 135–145)

## 2018-10-19 LAB — CBC
HCT: 25.6 % — ABNORMAL LOW (ref 36.0–46.0)
Hemoglobin: 8.1 g/dL — ABNORMAL LOW (ref 12.0–15.0)
MCH: 32.5 pg (ref 26.0–34.0)
MCHC: 31.6 g/dL (ref 30.0–36.0)
MCV: 102.8 fL — ABNORMAL HIGH (ref 80.0–100.0)
Platelets: 100 10*3/uL — ABNORMAL LOW (ref 150–400)
RBC: 2.49 MIL/uL — ABNORMAL LOW (ref 3.87–5.11)
RDW: 15.7 % — ABNORMAL HIGH (ref 11.5–15.5)
WBC: 4.3 10*3/uL (ref 4.0–10.5)
nRBC: 0 % (ref 0.0–0.2)

## 2018-10-19 LAB — OSMOLALITY, URINE: Osmolality, Ur: 381 mOsm/kg (ref 300–900)

## 2018-10-19 LAB — SODIUM, URINE, RANDOM: Sodium, Ur: 90 mmol/L

## 2018-10-19 LAB — GLUCOSE, CAPILLARY
Glucose-Capillary: 96 mg/dL (ref 70–99)
Glucose-Capillary: 92 mg/dL (ref 70–99)

## 2018-10-19 LAB — CREATININE, URINE, RANDOM: Creatinine, Urine: 49.4 mg/dL

## 2018-10-19 MED ORDER — SODIUM CHLORIDE 0.9 % IV SOLN
INTRAVENOUS | Status: AC
  Administered 2018-10-19: 13:00:00 via INTRAVENOUS

## 2018-10-19 MED ORDER — METOPROLOL TARTRATE 50 MG PO TABS
50.0000 mg | ORAL_TABLET | Freq: Two times a day (BID) | ORAL | Status: DC
  Administered 2018-10-19 – 2018-10-20 (×3): 50 mg via ORAL
  Filled 2018-10-19 (×3): qty 1

## 2018-10-19 NOTE — Plan of Care (Signed)
  Problem: Activity: Goal: Risk for activity intolerance will decrease Outcome: Progressing   Problem: Nutrition: Goal: Adequate nutrition will be maintained Outcome: Progressing   

## 2018-10-19 NOTE — Progress Notes (Signed)
Student pharmacist rounding on the [employer] service was requested to determine an appropriate dose conversion for the patient's beta blocker therapy. I have reviewed the patient's baseline laboratory values, history, and physical exam as documented in her chart. Currently she is experiencing profound hypotension, her last vitals reading showed BP 81/43 mmHg while taking carvedilol (Coreg) 12.5 mg by mouth twice daily. Based on the pharmacology of carvedilol, its actions include nonselective beta-adrenergic blocking with alpha-1 adrenergic blocking activity without the presence of intrinsic sympathomimetic activity. Her hypotension is likely being worsened by the lack of specificity of this agent.  I recommend commencing metoprolol tartrate (Lopressor) due to the increased selectivity and inhibition at beta-1 adrenergic receptors. Per [employer], the equivalent dose of metoprolol tartrate to the patient's current carvedilol daily dose (25 mg) is 100 mg of Lopressor divided into two doses by mouth (50 mg PO BID). Metoprolol tartrate is preferred over metoprolol succinate in this patient due to the shorter half- life and the option to hold one dose with hypotensive episodes.  Patient's vitals and presentation should continue to be monitored for dizziness, hypotension, heart block, and MI. If the patient's hypotension persists, consider further dose reduction.   [place]

## 2018-10-19 NOTE — Progress Notes (Addendum)
Subjective: Faith Holmes was interviewed by bedside. She is a very pleasant lady who continues to be hard of hearing and thus difficult to interview but very cooperative. When asked if she knows where she is, she states she is in a bed and couldn't specify if home vs. hospital. When asked how she is doing, states "good like same day every day". Denies any pain including no knee pain. Notably, was able to follow simple commands such as taking deep breaths while doing lung auscultation.   Objective: Vital signs in last 24 hours: Vitals:   10/19/18 0917 10/19/18 1025 10/19/18 1136 10/19/18 1145  BP: (!) 81/43 (!) 97/57 (!) 91/51 98/64  Pulse: 73 65 61 71  Resp: 18 16 16 16   Temp: 98.4 F (36.9 C) 97.9 F (36.6 C)    TempSrc: Oral Oral    SpO2: 99% 98% 100%    Weight change:   Intake/Output Summary (Last 24 hours) at 10/19/2018 1632 Last data filed at 10/19/2018 1500 Gross per 24 hour  Intake 1323.78 ml  Output 350 ml  Net 973.78 ml   Physical Exam Constitutional:      General: She is not in acute distress.    Appearance: She is not ill-appearing, toxic-appearing or diaphoretic.     NOT oriented to place. HENT:     Head: Normocephalic and atraumatic.  Eyes:     Extraocular Movements: Extraocular movements intact.  Cardiovascular:     Rate and Rhythm: Normal rate.     Pulses: Normal pulses.     Heart sounds: Normal heart sounds.  Pulmonary:     Effort: Pulmonary effort is normal. Lungs clear to auscultation bilaterally. Musculoskeletal: Normal range of motion.  Skin:    General: Skin is dry.     Findings: Echymosis around left eye. Diffuse bruising over body. No new bruises or any other acute changes. Psychiatric:        Behavior: Behavior normal to her baseline. Insight and Judgement: Limited.  Assessment/Plan: [name] with medical history significant for multiple recent falls, Chronic Anemia of multiple etiologies (including nutritional and EPO deficiency), AFIb on  Xarelto, Hypothyroidism, HTN, and Hearing Loss who presents with recurrent falls in setting of chronic poor appetite and polypharmacy.   Acute Kidney Injury in setting of CKD Stage 4:  Likely multifactorial etiology in setting of chronic poor PO intake and nephrotoxic drugs particularly given her advanced age, causing a mixed AKI picture (prerenal azotemia and ATN). Notably, she completed a course of Cefdinir 300mg  BID for ten days that ended on 09/21/18 for cystitis, chronic regular use of HCTZ and chronic poor oral intake.Cr last month was 1.38. GFR over the last ten years has ranged between 10s-30s and on this admit was 21. History of uncontrolled HTN with systolic BP in [id number]. In the ED, Cr was 2.18, BUN was 30, and Bicarb was 16. Bun:Cr ratio of ~14. Cr this AM is trending down (1.74).  Bicarb has normalized. -Cr trend (most recent first): 1.74 > 1.93 > 2.07  > 2.18 -BUN trend: 37>35>32>30 -IVF: Continue gentle hydration with NS @150ml /hr   -Strict I/Os and assess volume status and urine output closely -Bladder Scan once (ideally PVR) -Daily CBC and BMP  -Avoid nephrotoxins including NO NSAIDS or HCTZ.    Multiple Recent Falls:  Likely due to chronic dehydration and being on multiple BP lowering meds including HCTZ. Per daughter-in-law, has low BP in 105/60s @ home. Became hypotensive today working with OT. -PT/OT evaluation for  functional status and outpatient needs -Per PT and OT, SNF recommended -SW consult for SNF placement -Voltaren Gel 2g QID for Chronic R Hip Pain -PRN Tylenol  -Hold HCTZ, will switch carvedilol to metoprolol for less BP effect as below  Chronic Poor Appetite/Malnutrition: Chronic poor appetite from chart review. Per daughter-in-law drinks 2 cups of water a day and overall has very low appetite.  -Continue to encourage PO feed, feeding supplement, and IV fluid -Continue home Vit D, B12, and Iron. Added Folate on 8/17.   AFIB: EF 55-60% in 2009. Chart Review  has historical dx of CHF but unable to find further details about dx/prognosis. -From 8/16-8-18, we continued patient's home Coreg 12.5 mg BID. Home Coreg may be a contributory factor to current hypotension due to non-selective beta and apha-1 adrenergic blockade mechanism. -On 8/18, changed to Metoprolol Tartrate 50 mg BID for increased cardioselectivity  -Was anticoagulated with Xarelto at home. We did NOT resume Xarelto due to recent fall and diffuse ecchymosis. Eliquis 2.5mg  BID (s8/18) -Monitor on Cardiac Telemetry  Acute on Chronic Anemia: CT Head/Cervical in the ED were not concerning for any acute hemorrhage other than focal soft tissue hematoma overlying the LEFT orbital roof. No underlying fractures. In ED, pt @ baseline Hgb of 10.2 and MCV 101.6. Multiple chronic nutritional deficiencies and patient regularly takes Iron and B12 at home. Ferritin elevated at 558, TIBC low and Tsat normal, folate low at 4.1, consistent with folate deficiency but no active iron deficiency (although historical iron def anemia and pt regularly takes iron). Calculated Retic Index was 0.82 which suggests inappropriate bone marrow response in setting of known multiple nutritional deficiencies. LDH was 256 which is most likely due to hypoproliferation and less concerning for active hemolysis especially as Hgb has improved to 8.1 this AM. - Hgb trend (most recent): 8.1 > 7.4 > 7.8 > 10.2 -Will continue gentle hydration with NS @ 153ml/hr -Daily CBC  -Continue PO home iron, B12, and added Folate supplements  Thrombocytopenia: mild, appears to be chronic, unchanged. Unclear etiology, possible dx include nutritional deficiency (folate), liver dz, myelodysplastic syndrome, MM. Normal SPEP last year. Will check smear, hold off on further workup given age and comorbidities. -hepatic function panel was overall not significant and unchanged from prior hepatic function labs. AST 25, ALT 17, TBilli 1, Albumin 3 (seems to be @  her baseline) -Smear result pending  Mild Hyponatremia: Na of 131 on admission in setting of HCTZ use and chronic poor daily diet. Serum Osm was 279, Urine Na was 90, Urine Cr of 49. Calculated FENA of 2.5% suggests an intrinsic etiology of patient's AKI (possible from chronic prerenal hypovolemia leading to ATN vs. 10 day course of Cefdinir one month ago in setting of CKD4) -Na this AM stable at 130.  -Continue NS @ 137ml/hr -Encourage PO intake -Daily BMP -Continue holding HCTZ.   Polypharmacy Management summary in setting of advanced age and repeated falls: -d/c HCTZ (low BP and possible contributory factor for low Na) -Avoid NSAIDs for pain (nephrotoxic) and home Aspirin (mild baseline thrombocytopenia) -Changed COREG 12.5mg  BID to Metoprolol 50mg  BID (more cardioselectivity and less hypotension risk) -Changed Xarelto to Eliquis to lower bruising/bleeding risk    History of Hypothyroidism: TSH 3.62 -Continue home synthroid 19mcg  History of HTN: systolic BP at times in 0000000  -Holding home HCTZ   Mood Disorder:  -Continue home Lexapro 5mg    VTE PPX: SCD and Eliquis 2.5mg  BID  DISPOSITION: Continue workup of medical conditions and  possible SNF placement    LOS: 1 day   Faith Holmes, Medical Student 10/19/2018, 4:32 PM

## 2018-10-19 NOTE — Plan of Care (Signed)
  Problem: Coping: Goal: Level of anxiety will decrease Outcome: Progressing   Problem: Elimination: Goal: Will not experience complications related to bowel motility Outcome: Progressing   Problem: Pain Managment: Goal: General experience of comfort will improve Outcome: Progressing   

## 2018-10-19 NOTE — Discharge Summary (Addendum)
Name: Faith Holmes MRN: [id number] DOB: 1926-06-10 85 y.o. PCP: Blair Heys, PA-C  Date of Admission: 10/17/2018 10:35 AM Date of Discharge: 10/23/18 Attending Physician: Lenice Pressman, MD, PhD   Discharge Diagnosis:  Multiple recent falls in setting of chronic low appetite & polypharmacy Acute Kidney Injury in setting of Chronic Kidney Disease Stage 4 Acute on Chronic Anemia Persistent Atrial Fibrillation      Discharge Medications: Allergies as of 10/24/2018      Reactions   Codeine Other (See Comments)   unknown   Furosemide Other (See Comments)   Renal    Oxaprozin Nausea Only   Ramipril Cough      Medication List    STOP taking these medications   carvedilol 12.5 MG tablet Commonly known as: COREG   hydrochlorothiazide 12.5 MG tablet Commonly known as: HYDRODIURIL   potassium chloride 10 MEQ CR capsule Commonly known as: MICRO-K   Rivaroxaban 15 MG Tabs tablet Commonly known as: XARELTO     TAKE these medications   apixaban 2.5 MG Tabs tablet Commonly known as: Eliquis Take 1 tablet (2.5 mg total) by mouth 2 (two) times daily.   cyanocobalamin 100 MCG tablet Take 100 mcg by mouth daily.   escitalopram 5 MG tablet Commonly known as: LEXAPRO Take 5 mg by mouth at bedtime.   feeding supplement (ENSURE ENLIVE) Liqd Take 237 mLs by mouth 2 (two) times daily between meals.   folic acid 1 MG tablet Commonly known as: FOLVITE Take 1 tablet (1 mg total) by mouth daily.   levothyroxine 50 MCG tablet Commonly known as: SYNTHROID Take 50 mcg by mouth daily.   Nu-Iron 150 MG capsule Generic drug: iron polysaccharides Take 150 mg by mouth at bedtime.   Vitamin D3 25 MCG (1000 UT) Caps Take 1,000 Units by mouth daily.      Disposition and follow-up:   Faith Holmes was discharged from [facility] in Stable condition.  At the hospital follow up visit please address:  Multiple recent falls in setting of chronic low appetite  & polypharmacy Acute Kidney Injury in setting of Chronic Kidney Disease Stage 4 Acute on Chronic Anemia Persistent Atrial Fibrillation/Bradycardia   -Maximize oral hydration and nutrition as much as possible -Take daily nutritional supplements: B12, Folate, Iron -Very close supervision at home with PT/OT/Home Aide -Avoid HCTZ, Beta-Blocker, and all other non-prescribed meds -consider d/c lexapro -Avoided NSAIDs for pain and home Aspirin  (both nephrotoxic/increased bleeding risk)   Labs / imaging needed at time of follow-up: CBC and  BMP   Follow-up Appointments: Follow-up [place], [facility] Follow up.   Why: a clinician will f/u to schedule a visit for therapy  Contact information: [place] [address] [phone]        Blair Heys, PA-C Follow up in 1 week(s).   Specialty: Physician Assistant Contact information: [address] [place] [address] [place] Hospital Course by problem list: Faith Holmes is a [name] with history ofmultiplerecentfalls, chronic malnutrition, HTN, CKD4, chronic anemia, AFIb, hypothyroidism, and hearing losswho presented due to a fall and was found to have AKI and acute on chronic anemia.   Multiple recent falls: Patient lives alone at home while daughter-in-law lives next door. On day of admission, she had an unwitnessed fall. In the last 2 months, this is her third ED visit for falls. CT  Head showed focal soft tissue hematoma overlying the LEFT orbital roof. No underlying head or cervical acute fracture, acute intracranial abnormality, or hemorrhage. PT and OT evaluated and monitored her functional status. Initially, she was unwilling to get out of bed due to fatigue and also became hypotensive while trying to ambulate. Eventually, with close supervision and a rolling walker, she was able to walk a little out of her room. PT/OT highly recommended post-acute  rehab at 24-hr SNF. However, family refused SNF so we discharged her home with home health. Family was able to arrange a home health aide to come to the home daily.  Chronic poor appetite: Patient has chronic poor appetite which has been worsening over the recent months. We added supplements (Ensure, Pro-stat) to maximize nutrition and encouraged oral food intake which slightly improved over time.  Acute Kidney Injury in setting of Chronic Kidney Disease 4: In the ED, Cr was 2.18. Her baseline is 1.3. We gently administered IVF over two days which corrected her Cr down to 1.22 highly suggestive of dehydration as the cause of prerenal AKI.  Acute on Chronic Anemia: In the ED, her Hgb was 10.2 which dropped to 7.8 next day. However, there was no evidence of bleeding source from imaging or exam. Our labs indicated macrocytic anemia with folate deficiency, no current iron deficiency, and Retic Index of 0.82 suggestive of inadequate bone marrow response. We continued her home PO Iron, B12 and also started daily Folate. Without any other interventions, Hgb steadily increased. On discharge, it was 9.3.  Persistent Atrial Fibrillation/Bradycardia For rate control, she was taking Coreg 12.5mg  BID at home. Due to low blood pressure, we switched to Metoprolol Tartate 50mg  BID for increased cardioselectivity. This improved BP as we hoped. However she began to get bradycardic to 40-50s. Thus, we held metoprolol remainder of stay. Even without metoprolol or carvedilol, she had no instances of rapid ventricular responses. Given advanced age and recurrent falls, recommend no beta blocker at home. For stroke prevention, she was taking Xarelto 15mg  at home which we discontinued and started Eliquis 2.5mg  BID due to its safer bleeding risk profile with CKD and advanced age.   Hyponatremia: Na of 131 on admission in setting of HCTZ use and chronic poor nutrition. We held HCTZ, initially administered IVF, and then  encouraged PO nutrition. Her Na remained stable at 132 during this stay. Recommend not resuming HCTZ.  Hypothyroidism:  We continued patient on her home synthroid 56mcg. TSH was 3.62.   Mood Disorder:  Patient started taking Lexapro 5mg  in June 2020 after passing away of brother-in-law. Family did not see major improvement in mood since then. While we continued Lexapro, it could be discontinued outpatient to reduce hypotension risk and polypharmacy.  Thrombocytopenia: Chronic mild thrombocytopenia in 100s with possible dx of nutritional deficiency (folate), liver dz, myelodysplastic syndrome, MM. Normal SPEP last year. LFTs were unremarkable. Smear showed macrocytic anemia. Platelet improved daily and was 157 on discharge with improvement in nutrition and supplements.     Discharge Vitals:   BP 120/65 (BP Location: Right Arm)    Pulse 71    Temp 98 F (36.7 C) (Oral)    Resp 16    Wt 46.5 kg    SpO2 100%    BMI 20.01 kg/m   Pertinent Labs, Studies, and Procedures:  CBC Latest Ref Rng & Units 10/22/2018 10/21/2018 10/20/2018  WBC 4.0 - 10.5 K/uL 4.5 4.8 4.3  Hemoglobin 12.0 - 15.0 g/dL 9.3(L) 8.7(L) 8.6(L)  Hematocrit  36.0 - 46.0 % 28.4(L) 26.8(L) 27.4(L)  Platelets 150 - 400 K/uL 157 135(L) 125(L)   BMP Latest Ref Rng & Units 10/22/2018 10/21/2018 10/20/2018  Glucose 70 - 99 mg/dL 70 83 90  BUN 8 - 23 mg/dL 37(H) 35(H) 33(H)  Creatinine 0.44 - 1.00 mg/dL 1.22(H) 1.30(H) 1.41(H)  Sodium 135 - 145 mmol/L 132(L) 132(L) 132(L)  Potassium 3.5 - 5.1 mmol/L 3.9 3.7 3.7  Chloride 98 - 111 mmol/L 98 97(L) 101  CO2 22 - 32 mmol/L 23 24 21(L)  Calcium 8.9 - 10.3 mg/dL 8.8(L) 8.6(L) 8.3(L)    CLINICAL DATA:  Fall, blunt trauma, on Xarelto. Unwitnessed fall. Abrasions to LEFT shoulder and ribs.  EXAM: CT HEAD WITHOUT CONTRAST  CT CERVICAL SPINE WITHOUT CONTRAST  TECHNIQUE: Multidetector CT imaging of the head and cervical spine was performed following the standard protocol without  intravenous contrast. Multiplanar CT image reconstructions of the cervical spine were also generated.  COMPARISON:  Head CT dated 09/01/2018.  FINDINGS: CT HEAD FINDINGS  Brain: Mild generalized age related parenchymal volume loss with commensurate dilatation of the ventricles and sulci. Chronic small vessel ischemic changes again noted within the bilateral periventricular and subcortical white matter regions.  No mass, hemorrhage, edema or other evidence of acute parenchymal abnormality. No extra-axial hemorrhage.  Vascular: Chronic calcified atherosclerotic changes of the large vessels at the skull base. No unexpected hyperdense vessel.  Skull: Normal. Negative for fracture or focal lesion.  Sinuses/Orbits: No acute finding.  Other: Focal soft tissue hematoma overlying the LEFT orbital roof. No underlying fracture seen.  CT CERVICAL SPINE FINDINGS  Alignment: Minimal anterolisthesis of C6, related to at surrounding degenerative change. Mild dextroscoliosis of the lower cervical spine. No evidence of acute vertebral body subluxation.  Skull base and vertebrae: No fracture line or displaced fracture fragment seen. Facet joints appear normally aligned throughout.  Soft tissues and spinal canal: No prevertebral fluid or swelling. No visible canal hematoma.  Disc levels: Degenerative spondylosis throughout the cervical spine, mild to moderate in degree. No more than mild central canal stenosis at any level.  Upper chest: No acute findings.  Other: Bilateral carotid atherosclerosis.  IMPRESSION: 1. Focal soft tissue hematoma overlying the LEFT orbital roof. No underlying fracture. 2. No acute intracranial abnormality. No intracranial mass, hemorrhage or edema. 3. No fracture or acute subluxation within the cervical spine. Degenerative changes of the cervical spine, as detailed above. 4. Carotid atherosclerosis.  Electronically Signed   By: Franki Cabot M.D.   On: 10/17/2018 12:22  Discharge Instructions: Discharge Instructions    Diet - low sodium heart healthy   Complete by: As directed    Discharge instructions   Complete by: As directed    Hi Ms. Janousek,   You were hospitalized after you had an unwitnessed fall at your home. In the ED, we saw that you had Acute Kidney Injury which was most likely due to dehydration and chronic low food intake. Please try to drink plenty of fluids and eat food every day. Your hemoglobin level was also low. As you may know you have several vitamin deficiencies which are contributing to your chronic anemia and this could be a reason that may make you feel tired. We recommend you to take your supplements and maintain regular diet as much as possible.   We have stopped several of your home medication called hydrochlorothiazide. This is a pill used for hypertension. But your blood pressure in the hospital were low and we also believe  this medicine might have caused some of the kidney injury and increased your risk for fall. We have stopped your Xarelto which will help lower your bleeding risk. We have also stopped carvedilol as your heart rate and blood pressure were low. This medicine was helping with your Atrial Fibrillation. But we do not think it is worth the risk. If your heart rate starts to increase (higher than 100), we will let your primary care doctor discuss possibly resuming this medicine or any other options at that time.  It was our pleasure to be your doctors at [facility]. We wish you all the best!   Increase activity slowly   Complete by: As directed      Signed: Katherine Roan, MD 10/25/2018, 5:30 PM     Internal Medicine Attending Note:  I saw and examined the patient on the day of discharge. I reviewed and agree with the discharge summary written by the house staff.  Lenice Pressman, M.D., Ph.D.

## 2018-10-20 DIAGNOSIS — E871 Hypo-osmolality and hyponatremia: Secondary | ICD-10-CM | POA: Diagnosis present

## 2018-10-20 DIAGNOSIS — D696 Thrombocytopenia, unspecified: Secondary | ICD-10-CM | POA: Diagnosis present

## 2018-10-20 LAB — BASIC METABOLIC PANEL
Anion gap: 10 (ref 5–15)
BUN: 33 mg/dL — ABNORMAL HIGH (ref 8–23)
CO2: 21 mmol/L — ABNORMAL LOW (ref 22–32)
Calcium: 8.3 mg/dL — ABNORMAL LOW (ref 8.9–10.3)
Chloride: 101 mmol/L (ref 98–111)
Creatinine, Ser: 1.41 mg/dL — ABNORMAL HIGH (ref 0.44–1.00)
GFR calc Af Amer: 38 mL/min — ABNORMAL LOW (ref >60)
GFR calc non Af Amer: 33 mL/min — ABNORMAL LOW (ref >60)
Glucose, Bld: 90 mg/dL (ref 70–99)
Potassium: 3.7 mmol/L (ref 3.5–5.1)
Sodium: 132 mmol/L — ABNORMAL LOW (ref 135–145)

## 2018-10-20 LAB — GLUCOSE, CAPILLARY
Glucose-Capillary: 73 mg/dL (ref 70–99)
Glucose-Capillary: 113 mg/dL — ABNORMAL HIGH (ref 70–99)
Glucose-Capillary: 79 mg/dL (ref 70–99)

## 2018-10-20 LAB — CBC WITH DIFFERENTIAL/PLATELET
Abs Immature Granulocytes: 0.02 10*3/uL (ref 0.00–0.07)
Basophils Absolute: 0 10*3/uL (ref 0.0–0.1)
Basophils Relative: 1 %
Eosinophils Absolute: 0.1 10*3/uL (ref 0.0–0.5)
Eosinophils Relative: 3 %
HCT: 27.4 % — ABNORMAL LOW (ref 36.0–46.0)
Hemoglobin: 8.6 g/dL — ABNORMAL LOW (ref 12.0–15.0)
Immature Granulocytes: 1 %
Lymphocytes Relative: 20 %
Lymphs Abs: 0.8 10*3/uL (ref 0.7–4.0)
MCH: 33.2 pg (ref 26.0–34.0)
MCHC: 31.4 g/dL (ref 30.0–36.0)
MCV: 105.8 fL — ABNORMAL HIGH (ref 80.0–100.0)
Monocytes Absolute: 0.4 10*3/uL (ref 0.1–1.0)
Monocytes Relative: 10 %
Neutro Abs: 2.9 10*3/uL (ref 1.7–7.7)
Neutrophils Relative %: 65 %
Platelets: 125 10*3/uL — ABNORMAL LOW (ref 150–400)
RBC: 2.59 MIL/uL — ABNORMAL LOW (ref 3.87–5.11)
RDW: 15.6 % — ABNORMAL HIGH (ref 11.5–15.5)
WBC: 4.3 10*3/uL (ref 4.0–10.5)
nRBC: 0 % (ref 0.0–0.2)

## 2018-10-20 MED ORDER — FOLIC ACID 1 MG PO TABS
1.0000 mg | ORAL_TABLET | Freq: Every day | ORAL | Status: DC
  Administered 2018-10-20 – 2018-10-24 (×5): 1 mg via ORAL
  Filled 2018-10-20 (×5): qty 1

## 2018-10-20 NOTE — Progress Notes (Addendum)
Subjective: Ms. Famularo was interviewed by bedside. She is a very pleasant and cooperative lady who continues to be hard of hearing and thus difficult to interview. When asked how she is doing, states "good same like every day". Tries to get out of his bed but agreeable when requested to remain in bed. Does not seem to be in any acute pain.   Spoke to patient's daughter-in-law when she visited patient in-person. She reports she is the primary caregiver of patient and has been managing her health care navigation over the last few years. Ronny, patient's oldest son, is also involved but she is usually the one who relays info. When discussing PT/OT rec of SNF placement, she reports she definitely agrees patient's need for increased supervision, but she also states they as a family had several bad experiences with different SNFs in the past. She also wonders if an alternative could be increased supervision of patient at home through home health agencies. It was unclear how much they were able to pay out of pocket. She will check in with Ronny and would like to discuss possibility of SNF tomorrow more with [place] in person. She also wondered what we think about discontinuing her Lexapro. It was started in June 2020 to help her increase appetite but she does not think that has helped and she has not noticed any significant change in patient's overall mood/affect. All her questions about patient's current condition and medical management were answered.   Objective: Vital signs in last 24 hours: Vitals:   10/19/18 2147 10/20/18 0530 10/20/18 0900 10/20/18 1005  BP:  127/85 133/86 133/66  Pulse: 68 69 84 (!) 52  Resp:  16 18 18   Temp:  (!) 97.5 F (36.4 C) 97.7 F (36.5 C) 97.7 F (36.5 C)  TempSrc:  Oral Oral Oral  SpO2: 94% 98%  98%   Weight change:   Intake/Output Summary (Last 24 hours) at 10/20/2018 1542 Last data filed at 10/20/2018 1051 Gross per 24 hour  Intake 1466.86 ml  Output 1500 ml  Net  -33.14 ml   Physical Exam Constitutional:      General: She is in no acute distress.    Appearance: She is not ill-appearing, toxic-appearing, or diaphoretic.  HENT:     Head: Normocephalic and atraumatic.  Eyes:     Extraocular Movements: Extraocular movements intact.  Cardiovascular:     Rate and Rhythm: Normal rate.     Pulses: Normal pulses.  Pulmonary:     Effort: Pulmonary effort is normal. Abdominal: Normoactive bowel sounds, non-tender, non-distended Musculoskeletal: Normal range of motion.  Skin:    General: Skin is dry.     Findings: Old echymosis. No new bruises or any other acute changes. Psychiatric:        Behavior: Behavior normal to her baseline. Insight and Judgement: Limited.  Assessment/Plan: [name] with medical history significant for multiple recent falls, chronic anemia, AFIb, hypothyroidism, HTN, and hearing loss who presents with recurrent falls, AKI on CKD, and acute on chronic anemia. Improving with gentle IV hydration.   Acute Kidney Injury in setting of CKD Stage 4:  Possibly prerenal due to poor PO intake as she has improved significantly with IV fluids -Cr trend (most recent first): 1.41> 1.74 > 1.93 > 2.07  > 2.18 -PVR on 8/19 was 83ml -D/C'ed IVF on 8/19 -Continue strict I/Os and assess volume status and urine output closely -Daily CBC and BMP  -Avoid nephrotoxins including NO NSAIDS or HCTZ.  Multiple Recent Falls:  Likely due to chronic dehydration and being on multiple BP lowering meds including HCTZ. Per daughter-in-law, has low BP in 105/60s @ home. Became hypotensive yesterday working with OT. -Per PT and OT, SNF recommended -SW consult for SNF placement -Voltaren Gel 2g QID for Chronic R Hip Pain -PRN Tylenol  -Hold HCTZ -On 8/18, switched from carvedilol to metoprolol for less BP effect as below   Chronic Poor Appetite/Malnutrition: Chronic poor appetite from chart review. Per daughter-in-law drinks 2 cups of water a day and  overall has very low appetite.  -Continue to encourage PO feed, feeding supplement, and IV fluid -Continue home Vit D, B12, and Iron. Added Folate on 8/17.   Persistent AFIB: EF 55-60% in 2009. Chart Review has historical dx of CHF but unable to find further details about dx/prognosis. -From 8/16-8-18, we continued patient's home Coreg 12.5 mg BID. Home Coreg may be a contributory factor to current hypotension due to non-selective beta and apha-1 adrenergic blockade mechanism. -On 8/18, changed to Metoprolol Tartrate 50 mg BID for increased cardioselectivity. Continues to have good rate control. -Was anticoagulated with Xarelto at home. We did NOT resume Xarelto due to recent fall and diffuse ecchymosis. Eliquis 2.5mg  BID (s8/18) -Continue on Cardiac Telemetry  Acute on Chronic Anemia: No evidence of ongoing bleeding, Hgb has stabilized. MCV >100, ferritin elevated at 558, TIBC low and Tsat normal, folate low at 4.1, consistent with folate deficiency but no active iron deficiency (although historical iron def anemia and pt regularly takes iron). Retic Index was 0.82 which suggests inadequate bone marrow response.  - Hgb trend (most recent): 8.6 >8.1 > 7.4 > 7.8 > 10.2 - Daily CBC  - Continue PO home iron, B12, Folate supplements  Thrombocytopenia: mild, appears to be chronic, unchanged. Unclear etiology, possible dx include nutritional deficiency (folate), liver dz, myelodysplastic syndrome, MM. Normal SPEP last year. Will check smear, hold off on further workup given age and comorbidities. -hepatic function panel was overall not significant and unchanged from prior hepatic function labs. AST 25, ALT 17, TBilli 1, Albumin 3 (seems to be @ her baseline) -Smear shows macrocytic anemia and thrombocytopenia  Mild Hyponatremia: Na of 131 on admission in setting of HCTZ use and chronic poor daily diet. Serum Osm was 279, Urine Na was 90, urine osm 381, both inappropriately high indicating excessive ADH  activity. HCTZ and poor nutrition likely contributed as well. -Na this AM stable at 132 -Encourage PO intake -Daily BMP -Continue holding HCTZ   Polypharmacy Management summary in setting of advanced age and repeated falls: -d/c'ed HCTZ (low BP and possible contributory factor for low Na) -Avoid NSAIDs for pain (nephrotoxic) and home Aspirin (mild baseline thrombocytopenia) -Changed carvedilol 12.5mg  BID to Metoprolol 50mg  BID (more cardioselectivity and less hypotension risk) -Changed Xarelto to Eliquis to lower bruising/bleeding risk    History of Hypothyroidism: TSH 3.62 -Continue home synthroid 72mcg  History of HTN: systolic BP at times in 0000000  -Holding home HCTZ   Mood Disorder:  -Continue home Lexapro 5mg    VTE PPX: SCD and Eliquis 2.5mg  BID  DISPOSITION: Medically, patient is progressing well; possible SNF placement afterwards    LOS: 2 days   Zara Council, Medical Student 10/20/2018, 3:42 PM

## 2018-10-20 NOTE — Plan of Care (Signed)
  Problem: Nutrition: Goal: Adequate nutrition will be maintained Outcome: Progressing   

## 2018-10-20 NOTE — Progress Notes (Signed)
Notified MD Asif Humphrey Rolls that pt had removed IV and did not have any IV access. MD stated he would change IV medication to PO and that no IV access is needed at this time.   Paulla Fore, RN

## 2018-10-20 NOTE — Progress Notes (Signed)
Spoke with RN Lanelle Bal and she is going to contact the MD about switching the patient's IV medication to PO since she continues to pull out her IV access. Patient taking meds by mouth with no problems at this time.

## 2018-10-20 NOTE — Progress Notes (Signed)
Physical Therapy Treatment Patient Details Name: Faith Holmes MRN: [id number] DOB: 1927/03/02 Today's Date: 10/20/2018    History of Present Illness 85 y.o. female presenting after unobserved fall. Daughter-in-law states she found patient lying on the floor near her bed in a conscious state. In the ED, CT Head & Cervical Spine showed focal soft tissue hematoma overlying the LEFT orbital roof. No underlying fracture, acute intracranial abnormality, or hemorrhage. No fracture of cervical spine. COVID test negative. PMH including HTN, HLD, DM II (has not been on any oral medication) diet-controlled, Afib, dCHF, mild aortic stenosis, arthritis, COVID (in June), and Chronic iron deficiency.     PT Comments    Pt is in bed and declining OOB due to fatigue and general low energy.  Pt is able to do assisted LE ex's well, with minor cues for ex technique.  Plan will be to transfer to SNF when ready, and will work toward return to home in her previous living situation.  Follow acutely for the previous POC, and to progress to gait as she is able.  OOB to chair as tolerated as well.   Follow Up Recommendations  SNF     Equipment Recommendations  Rolling walker with 5" wheels;3in1 (PT)    Recommendations for Other Services       Precautions / Restrictions Precautions Precautions: Fall Precaution Comments: very low BPs on PT eval Restrictions Weight Bearing Restrictions: No    Mobility  Bed Mobility Overal bed mobility: Needs Assistance             General bed mobility comments: pt would not get OOB  Transfers                    Ambulation/Gait                 Stairs             Wheelchair Mobility    Modified Rankin (Stroke Patients Only)       Balance                                            Cognition Arousal/Alertness: Awake/alert Behavior During Therapy: Flat affect Overall Cognitive Status: Impaired/Different from  baseline Area of Impairment: Problem solving;Awareness;Safety/judgement;Following commands                 Orientation Level: Situation Current Attention Level: Selective Memory: Decreased recall of precautions;Decreased short-term memory Following Commands: Follows one step commands inconsistently;Follows one step commands with increased time Safety/Judgement: Decreased awareness of safety;Decreased awareness of deficits Awareness: Intellectual Problem Solving: Slow processing;Requires verbal cues;Requires tactile cues General Comments: Pt is unable to agree to being up to walk, but agreed to do bed ex      Exercises General Exercises - Lower Extremity Ankle Circles/Pumps: AAROM;Both;5 reps Quad Sets: AAROM;Both;10 reps Gluteal Sets: AAROM;Both;10 reps Heel Slides: AAROM;Both;20 reps Hip ABduction/ADduction: AAROM;Both;20 reps Straight Leg Raises: AAROM;Both;20 reps    General Comments General comments (skin integrity, edema, etc.): pt was only able to perform ex's with PT giving her tactile written and verbal prompts      Pertinent Vitals/Pain Pain Assessment: Faces Pain Score: 0-No pain Faces Pain Scale: No hurt    Home Living                      Prior Function  PT Goals (current goals can now be found in the care plan section) Acute Rehab PT Goals Patient Stated Goal: none given Progress towards PT goals: Progressing toward goals    Frequency    Min 3X/week      PT Plan Current plan remains appropriate    Co-evaluation              AM-PAC PT "6 Clicks" Mobility   Outcome Measure  Help needed turning from your back to your side while in a flat bed without using bedrails?: A Little Help needed moving from lying on your back to sitting on the side of a flat bed without using bedrails?: A Little Help needed moving to and from a bed to a chair (including a wheelchair)?: A Little Help needed standing up from a chair using your  arms (e.g., wheelchair or bedside chair)?: A Little Help needed to walk in hospital room?: A Lot Help needed climbing 3-5 steps with a railing? : A Lot 6 Click Score: 16    End of Session   Activity Tolerance: Patient limited by fatigue Patient left: in bed;with call bell/phone within reach;with bed alarm set Nurse Communication: Mobility status PT Visit Diagnosis: Muscle weakness (generalized) (M62.81);Difficulty in walking, not elsewhere classified (R26.2);Adult, failure to thrive (R62.7)     Time: [id number] PT Time Calculation (min) (ACUTE ONLY): 23 min  Charges:  $Therapeutic Exercise: 8-22 mins $Therapeutic Activity: 8-22 mins                    WHITNEY BRENCHLEY 10/20/2018, 4:51 PM   Mee Hives, PT MS Acute Rehab Dept. Number: [place] [facility] [place]

## 2018-10-21 DIAGNOSIS — E162 Hypoglycemia, unspecified: Secondary | ICD-10-CM

## 2018-10-21 LAB — CBC
HCT: 26.8 % — ABNORMAL LOW (ref 36.0–46.0)
Hemoglobin: 8.7 g/dL — ABNORMAL LOW (ref 12.0–15.0)
MCH: 33.5 pg (ref 26.0–34.0)
MCHC: 32.5 g/dL (ref 30.0–36.0)
MCV: 103.1 fL — ABNORMAL HIGH (ref 80.0–100.0)
Platelets: 135 10*3/uL — ABNORMAL LOW (ref 150–400)
RBC: 2.6 MIL/uL — ABNORMAL LOW (ref 3.87–5.11)
RDW: 15.6 % — ABNORMAL HIGH (ref 11.5–15.5)
WBC: 4.8 10*3/uL (ref 4.0–10.5)
nRBC: 0 % (ref 0.0–0.2)

## 2018-10-21 LAB — BASIC METABOLIC PANEL
Anion gap: 11 (ref 5–15)
BUN: 35 mg/dL — ABNORMAL HIGH (ref 8–23)
CO2: 24 mmol/L (ref 22–32)
Calcium: 8.6 mg/dL — ABNORMAL LOW (ref 8.9–10.3)
Chloride: 97 mmol/L — ABNORMAL LOW (ref 98–111)
Creatinine, Ser: 1.3 mg/dL — ABNORMAL HIGH (ref 0.44–1.00)
GFR calc Af Amer: 42 mL/min — ABNORMAL LOW (ref >60)
GFR calc non Af Amer: 36 mL/min — ABNORMAL LOW (ref >60)
Glucose, Bld: 83 mg/dL (ref 70–99)
Potassium: 3.7 mmol/L (ref 3.5–5.1)
Sodium: 132 mmol/L — ABNORMAL LOW (ref 135–145)

## 2018-10-21 LAB — GLUCOSE, CAPILLARY
Glucose-Capillary: 65 mg/dL — ABNORMAL LOW (ref 70–99)
Glucose-Capillary: 69 mg/dL — ABNORMAL LOW (ref 70–99)
Glucose-Capillary: 82 mg/dL (ref 70–99)
Glucose-Capillary: 87 mg/dL (ref 70–99)
Glucose-Capillary: 96 mg/dL (ref 70–99)

## 2018-10-21 MED ORDER — ENSURE ENLIVE PO LIQD
237.0000 mL | Freq: Two times a day (BID) | ORAL | Status: DC
  Administered 2018-10-21 – 2018-10-24 (×6): 237 mL via ORAL

## 2018-10-21 MED ORDER — METOPROLOL TARTRATE 12.5 MG HALF TABLET: 12.5000 mg | ORAL_TABLET | Freq: Two times a day (BID) | ORAL | Status: DC

## 2018-10-21 NOTE — Progress Notes (Addendum)
Hypoglycemic Event  CBG: 69  Treatment: crackers and juice Symptoms: asymptomatic  Follow-up CBG: Time: 10/21/2018 @ 0628 CBG Result: 65  Possible Reasons for Event: poor intake  Comments/MD notified: On call provider IMTS notified. Gave orders to recheck in 20 mins. CBG rechecked, now 82.     Faith Holmes  Faith Holmes

## 2018-10-21 NOTE — Progress Notes (Addendum)
Subjective: Faith Holmes was seen by the bedside. She was lying in bed and was less engaged compared to the last few days. Does not seem to be in any acute pain. Was cooperative while performing physical exams. Did not have any comments/questions.  Objective:  Vital signs in last 24 hours: Vitals:   10/20/18 2151 10/20/18 2308 10/21/18 0452 10/21/18 0900  BP: (!) 101/59  130/86 128/78  Pulse: (!) 53 96 67 68  Resp: 17  18 18   Temp: 97.7 F (36.5 C)  97.8 F (36.6 C) (!) 97 F (36.1 C)  TempSrc: Oral  Oral Oral  SpO2: 100% 90% 100% 98%  Weight: 50.8 kg      Weight change:   Intake/Output Summary (Last 24 hours) at 10/21/2018 1657 Last data filed at 10/21/2018 1300 Gross per 24 hour  Intake 160 ml  Output 0 ml  Net 160 ml   Physical Exam Constitutional:      General: Baseline cachexia. She is in no acute distress.     Appearance: She is NOT ill-appearing, toxic-appearing, or diaphoretic.  HENT:     Head: Normocephalic and atraumatic.  Eyes:     Extraocular Movements: Extraocular movements intact.  Cardiovascular:     Rate and Rhythm: Bradycardic. Pulmonary:     Effort: Pulmonary effort is normal. Abdominal: Hypoactive bowel sounds, non-tender, non-distended, chronic epigastric hernia Musculoskeletal: Normal range of motion.  Skin:    General: Skin is dry.     Findings: Old echymosis. No new bruises or any other acute changes. Psychiatric:        Affect: Constricted (compared to yesterday)  Assessment/Plan: [name] with medical history significant for multiple recent falls, chronic anemia, AFIb, hypothyroidism, HTN, and hearing loss who presents with recurrent falls, AKI on CKD4, and acute on chronic anemia.   Chronic Poor Appetite/Malnutrition: Continues to have poor PO intake this hospitalization. Had a blood glucose of 65 early AM but was responsive and asymptomatic. Received some crackers and juice which increased her CBG to 82. -Continue to encourage PO food  and feeding supplement -Continue home Vit D, B12, and Iron. Added Folate on 8/17.  Multiple Recent Falls:  Likely due to chronic dehydration and being on multiple BP lowering meds including HCTZ. Per daughter-in-law, has low BP in 105/60s @ home. BP continues to be in normotensive range.  -Per PT and OT, SNF recommended. Daughter-in-law denied SNF (see SW note from 8/20). -Voltaren Gel 2g QID & PRN Tylenol for Chronic R Hip Pain -Hold HCTZ -On 8/18, switched from carvedilol to metoprolol for less BP effect as below   Acute Kidney Injury in setting of CKD Stage 4:  Possibly prerenal due to poor PO intake as she has improved significantly with IV fluids. Cr has returned to baseline. On 8/19: D/C'ed IVF.  -Cr trend (most recent first): 1.3 > 1.41> 1.74 > 1.93 > 2.07  > 2.18 -Continue strict I/Os, assess volume status, and urine output closely -Daily CBC and BMP  Avoid nephrotoxins including NO NSAIDS or HCTZ.   Persistent AFIB: EF 55-60% in 2009. Yesterday and this AM had a few 2+ second pauses. Also bradycardic in 30s this AM which is most likely due to change from home coreg to metoprolol.  -Hold metoprolol for today and reassess tomorrow -Q4H Vitals -Eliquis 2.5mg  BID (s8/18)  -Continue Cardiac Telemetry  Acute on Chronic Anemia: No evidence of ongoing bleeding, Hgb has stabilized. MCV >100, ferritin elevated at 558, TIBC low and Tsat normal, folate  low at 4.1, consistent with folate deficiency but no active iron deficiency (although historical iron def anemia and pt regularly takes iron). Retic Index was 0.82 which suggests inadequate bone marrow response.  - Hgb trend (most recent): 8.7 > 8.6 >8.1 > 7.4 > 7.8 > 10.2 - Continue PO home iron, B12, Folate supplements - Daily CBC   Mild Hyponatremia: Na of 131 on admission in setting of HCTZ use and chronic poor daily diet. Serum Osm was 279, Urine Na was 90, urine osm 381, both inappropriately high indicating excessive ADH activity. HCTZ  and poor nutrition likely contributed as well. -Na stable at 132 -Hold HCTZ -Daily BMP  Thrombocytopenia: mild, appears to be chronic, unchanged. Unclear etiology, possible dx include nutritional deficiency (folate), liver dz, myelodysplastic syndrome, MM. Normal SPEP last year. AST 25, ALT 17, TBilli 1, Albumin 3 (seems to be @ her baseline).Smear shows macrocytic anemia and thrombocytopenia. -Hold off on further workup given age and comorbidities.  History of Hypothyroidism: TSH 3.62 -Continue home synthroid 64mcg  Hypotension: Although history of HTN, on presentation to ED, had low BP. BP improving. Yesterday, ranged between 101/59-134/91. -Hold home HCTZ   Mood Disorder:  -Continue home Lexapro 5mg   Polypharmacy Management summary in setting of advanced age and repeated falls: -d/c'ed HCTZ (low BP and possible contributory factor for low Na -Avoid NSAIDs for pain (nephrotoxic) and home Aspirin (mild baseline thrombocytopenia) -Changed carvedilol 12.5mg  BID to Metoprolol 50mg  BID (more cardioselectivity and less hypotension risk)- temporarily holding Metop for today due to bradycardia -Changed Xarelto to Eliquis to lower bruising/bleeding risk -Consider d/cing Lexapro   VTE PPX: SCD and Eliquis 2.5mg  BID  DISPOSITION: Medically, patient is progressing well; possible SNF placement afterwards    LOS: 3 days   Zara Council, Medical Student 10/21/2018, 4:57 PM

## 2018-10-21 NOTE — TOC Initial Note (Signed)
Transition of Care [facility]) - Initial/Assessment Note    Patient Details  Name: Faith Holmes MRN: [id number] Date of Birth: 1926-04-16  Transition of Care [facility]) CM/SW Contact:    Sable Feil, LCSW Phone Number: 10/21/2018, 2:47 PM  Clinical Narrative: CSW talked with patient's daughter-in-law, Latima Common, who was at the bedside. Ms. Carlyon Shadow reported that her husband (patient's son) died in 04-07-07. Daughter-in-law added that patient's brother-in-law died (husband of her only living sibling) in June of this year and she has gone down hill since then. Per daughter-in-law, patient could cook and do some other things for herself, but now she does everything for patient. When asked, daughter-in-law responded that patient is hard-of-hearing and wears hearing aids, but she did not bring to the hospital. Ms. Carlyon Shadow reported that patient has never been to a SNF for rehab, and has never had home health services.   Discharge disposition discussed including recommendation of ST rehab and this was declined by daughter-in-law as she and other family will not be able to see her. Home health services requested and Medicare.gov list provided.                  Expected Discharge Plan: [place] Barriers to Discharge: Continued Medical Work up   Patient Goals and CMS Choice Patient states their goals for this hospitalization and ongoing recovery are:: Daughter-in-law Latysha Guerrette expresed that she wants patient to come home once ready for discharge so that family can continue to care for her with [facility] service,.3. Luis Lopez CMS Medicare.gov Compare Post Acute Care list provided to:: Patient Represenative (must comment)(Home Health list provided to Ms. Sanda Klein - daughter-in-law. She also requested to keep SNF list) Choice offered to / list presented to : Adult Children(Daughter-in-law Sanda Klein)  Expected Discharge Plan and Services Expected Discharge Plan: [place] In-house Referral: Clinical Social Work   Post Acute Care Choice: [place] arrangements for the past 2 months: (Home alone - daughter-in-law lives next door)                                     Prior Living Arrangements/Services Living arrangements for the past 2 months: (Home alone - daughter-in-law lives next door) Lives with:: Self Patient language and need for interpreter reviewed:: No Do you feel safe going back to the place where you live?: Yes(Darlene Rosita Fire, daughter-in-law wants patient at home where she and other family can see her & assist in her care)      Need for Family Participation in Patient Care: Yes (Comment) Care giver support system in place?: Yes (comment) Current home services: Other (comment)(No HH services, daughter-in-law looks after patient) Criminal Activity/Legal Involvement Pertinent to Current Situation/Hospitalization: No - Comment as needed  Activities of Daily Living      Permission Sought/Granted   Permission granted to share information with : No(Patient oriented to self only. Daughter-in-law Lamiracle Owens at the bedside)              Emotional Assessment Appearance:: Appears stated age Attitude/Demeanor/Rapport: Other (comment)(Quiet) Affect (typically observed): Calm, Appropriate Orientation: : Oriented to Self Alcohol / Substance Use: Tobacco Use, Alcohol Use, Illicit Drugs(Per H&P, patient does not drink, smoke or use illicit drugs) Psych Involvement: No (comment)  Admission diagnosis:  AKI (acute kidney injury) ([place]) [N17.9] Contusion of face, initial encounter [S00.83XA] Fall, initial encounter [W19.XXXA] Patient  Active Problem List   Diagnosis Date Noted  . Hyponatremia 10/20/2018  . Thrombocytopenia ([place]) 10/20/2018  . AKI (acute kidney injury) ([place]) 10/17/2018  . Hyperkalemia 08/23/2018  . ARF (acute renal failure) ([place]) 08/23/2018  . COVID-19 virus infection 08/23/2018  .  Hypotension 08/23/2018  . Dehydration 08/23/2018  . HTN (hypertension) 08/23/2018  . Persistent atrial fibrillation 08/23/2018  . HLD (hyperlipidemia) 08/23/2018  . Hypothyroidism 08/23/2018  . Acute on chronic diastolic CHF (congestive heart failure) ([place]) 08/23/2018  . Iron deficiency anemia 07/01/2017  . 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  Readmission Risk Interventions No flowsheet data found.

## 2018-10-21 NOTE — Care Management Important Message (Signed)
Important Message  Patient Details  Name: Faith Holmes MRN: [id number] Date of Birth: Jan 16, 1927   Medicare Important Message Given:  Yes     Orbie Pyo 10/21/2018, 2:49 PM

## 2018-10-21 NOTE — Plan of Care (Signed)
  Problem: Activity: Goal: Risk for activity intolerance will decrease Outcome: Progressing   

## 2018-10-22 LAB — BASIC METABOLIC PANEL
Anion gap: 11 (ref 5–15)
BUN: 37 mg/dL — ABNORMAL HIGH (ref 8–23)
CO2: 23 mmol/L (ref 22–32)
Calcium: 8.8 mg/dL — ABNORMAL LOW (ref 8.9–10.3)
Chloride: 98 mmol/L (ref 98–111)
Creatinine, Ser: 1.22 mg/dL — ABNORMAL HIGH (ref 0.44–1.00)
GFR calc Af Amer: 45 mL/min — ABNORMAL LOW (ref >60)
GFR calc non Af Amer: 39 mL/min — ABNORMAL LOW (ref >60)
Glucose, Bld: 70 mg/dL (ref 70–99)
Potassium: 3.9 mmol/L (ref 3.5–5.1)
Sodium: 132 mmol/L — ABNORMAL LOW (ref 135–145)

## 2018-10-22 LAB — CBC
HCT: 28.4 % — ABNORMAL LOW (ref 36.0–46.0)
Hemoglobin: 9.3 g/dL — ABNORMAL LOW (ref 12.0–15.0)
MCH: 34.2 pg — ABNORMAL HIGH (ref 26.0–34.0)
MCHC: 32.7 g/dL (ref 30.0–36.0)
MCV: 104.4 fL — ABNORMAL HIGH (ref 80.0–100.0)
Platelets: 157 10*3/uL (ref 150–400)
RBC: 2.72 MIL/uL — ABNORMAL LOW (ref 3.87–5.11)
RDW: 15.9 % — ABNORMAL HIGH (ref 11.5–15.5)
WBC: 4.5 10*3/uL (ref 4.0–10.5)
nRBC: 0.4 % — ABNORMAL HIGH (ref 0.0–0.2)

## 2018-10-22 NOTE — Progress Notes (Signed)
Occupational Therapy Treatment Patient Details Name: Faith Holmes MRN: [id number] DOB: [birth year] Today's Date: 10/22/2018    History of present illness 85 y.o. female presenting after unobserved fall. Daughter-in-law states she found patient lying on the floor near her bed in a conscious state. In the ED, CT Head & Cervical Spine showed focal soft tissue hematoma overlying the LEFT orbital roof. No underlying fracture, acute intracranial abnormality, or hemorrhage. No fracture of cervical spine. COVID test negative. PMH including HTN, HLD, DM II (has not been on any oral medication) diet-controlled, Afib, dCHF, mild aortic stenosis, arthritis, COVID (in June), and Chronic iron deficiency.    OT comments  Pt making progress with functional goals. Pt required min A for bed mobiliyt to sit EOB, min guard A for UB ADLs sitting EOB, mod A with Lb simulated bathing seated and max A to don socks. Pt required min A +2 for sit - stand and min A using RW to ambulate x 20 ft. Pt with Poor standing balance and is a high fall risk. Pt's son present and discussed post acute rehab at [facility]. Pt lives at home alone and will not be able to have 24/7 assist/sup at this time. Pt very pleasant and cooperative. OT will continue to follow acutely  Follow Up Recommendations  SNF;Supervision/Assistance - 24 hour    Equipment Recommendations  Other (comment)(TBD at next venue of care)    Recommendations for Other Services      Precautions / Restrictions Precautions Precautions: Fall Restrictions Weight Bearing Restrictions: No       Mobility Bed Mobility Overal bed mobility: Needs Assistance Bed Mobility: Sit to Supine     Supine to sit: Min assist;HOB elevated     General bed mobility comments: increased time, assist to elevate trunk  Transfers Overall transfer level: Needs assistance Equipment used: Rolling walker (2 wheeled) Transfers: Sit to/from [employer] Transfers   Stand pivot  transfers: Min assist;+2 physical assistance;+2 safety/equipment       General transfer comment: A to power up into standing and to prevent posterior lean. Pt then requiring Min A for balance during ambulation    Balance Overall balance assessment: Needs assistance Sitting-balance support: No upper extremity supported;Feet supported Sitting balance-Leahy Scale: Fair     Standing balance support: Bilateral upper extremity supported;During functional activity Standing balance-Leahy Scale: Poor                             ADL either performed or assessed with clinical judgement   ADL Overall ADL's : Needs assistance/impaired Eating/Feeding: Set up;Sitting   Grooming: Min guard;Wash/dry hands;Wash/dry face;Sitting   Upper Body Bathing: Min guard;Sitting Upper Body Bathing Details (indicate cue type and reason): simulated Lower Body Bathing: Moderate assistance;Sitting/lateral leans Lower Body Bathing Details (indicate cue type and reason): simulated Upper Body Dressing : Min guard;Sitting     Lower Body Dressing Details (indicate cue type and reason): Max A to don socks sitting EOB Toilet Transfer: Minimal assistance;+2 for physical assistance;+2 for safety/equipment;Ambulation;RW;Cueing for safety;Cueing for sequencing   Toileting- Clothing Manipulation and Hygiene: Moderate assistance;Sit to/from stand       Functional mobility during ADLs: Minimal assistance;+2 for physical assistance;+2 for safety/equipment;Cueing for safety;Cueing for sequencing;Rolling walker General ADL Comments: Pt with Poor standing balance for safe ADLs     Vision Patient Visual Report: No change from baseline     Perception     Praxis      Cognition  Arousal/Alertness: Awake/alert Behavior During Therapy: Flat affect Overall Cognitive Status: Impaired/Different from baseline Area of Impairment: Problem solving;Awareness;Safety/judgement;Following commands                      Memory: Decreased recall of precautions;Decreased short-term memory Following Commands: Follows one step commands inconsistently;Follows one step commands with increased time Safety/Judgement: Decreased awareness of safety;Decreased awareness of deficits   Problem Solving: Slow processing;Requires verbal cues;Requires tactile cues General Comments: pt is very [facility]        Exercises     Shoulder Instructions       General Comments      Pertinent Vitals/ Pain       Pain Assessment: Faces Faces Pain Scale: Hurts little more Pain Location: bottom after sitting in recliner, repositoned with pads Pain Descriptors / Indicators: Sore;Discomfort Pain Intervention(s): Monitored during session;Repositioned  Home Living Family/patient expects to be discharged to:: Private residence Living Arrangements: Children Available Help at Discharge: Family;Available 24 hours/day Type of Home: House                                  Prior Functioning/Environment              Frequency  Min 2X/week        Progress Toward Goals  OT Goals(current goals can now be found in the care plan section)  Progress towards OT goals: Progressing toward goals     Plan Discharge plan remains appropriate    Co-evaluation    PT/OT/SLP Co-Evaluation/Treatment: Yes Reason for Co-Treatment: For patient/therapist safety          AM-PAC OT "6 Clicks" Daily Activity     Outcome Measure   Help from another person eating meals?: A Little Help from another person taking care of personal grooming?: A Little Help from another person toileting, which includes using toliet, bedpan, or urinal?: A Lot Help from another person bathing (including washing, rinsing, drying)?: A Lot Help from another person to put on and taking off regular upper body clothing?: A Little Help from another person to put on and taking off regular lower body clothing?: A Lot 6 Click Score: 15    End of  Session Equipment Utilized During Treatment: Rolling walker;Gait belt  OT Visit Diagnosis: Unsteadiness on feet (R26.81);Other abnormalities of gait and mobility (R26.89);Muscle weakness (generalized) (M62.81);History of falling (Z91.81);Repeated falls (R29.6);Other symptoms and signs involving cognitive function   Activity Tolerance Patient tolerated treatment well   Patient Left in chair;with call bell/phone within reach;with chair alarm set;with family/visitor present   Nurse Communication          Time: 1401-1430 OT Time Calculation (min): 29 min  Charges: OT General Charges $OT Visit: 1 Visit OT Treatments $Self Care/Home Management : 8-22 mins     Britt Bottom 10/22/2018, 3:17 PM

## 2018-10-22 NOTE — TOC Progression Note (Signed)
Transition of Care [facility]) - Progression Note    Patient Details  Name: Faith Holmes MRN: [id number] Date of Birth: 07-05-1926  [facility]) CM/SW Contact  Bartholomew Crews, RN Phone Number: [phone] 10/22/2018, 12:34 PM  Clinical Narrative:    Damaris Schooner with Darlene (patient's dil) on the phone to follow up with [facility] agency. Referral place to [facility] - pending acceptance for PT and OT. Patient will need HH orders for PT and OT with [facility]. Darlene reiterated that they will be bring patient home. CM to follow for transition of care needs.    Expected Discharge Plan: [place] Barriers to Discharge: Continued Medical Work up  Expected Discharge Plan and Services Expected Discharge Plan: [place] In-house Referral: Clinical Social Work   Post Acute Care Choice: [place] arrangements for the past 2 months: (Home alone - daughter-in-law lives next door)                                       Social Determinants of Health (SDOH) Interventions    Readmission Risk Interventions No flowsheet data found.

## 2018-10-22 NOTE — Progress Notes (Addendum)
Subjective: Faith Holmes was seen by the bedside. She was sitting at an angle with head of bed elevated at 30 degree eating some breakfast. Continues to be hard of hearing and had no hearing aid on. Denies any pain. Alert, very pleasant, and cooperative. Did not have any questions.   Objective:  Vital signs in last 24 hours: Vitals:   10/21/18 2103 10/21/18 2110 10/22/18 0448 10/22/18 1008  BP: 104/62  125/60 (!) 94/53  Pulse: (!) 111 (!) 55 (!) 46 (!) 51  Resp: 17  17 14   Temp: 97.6 F (36.4 C)  97.6 F (36.4 C) 97.9 F (36.6 C)  TempSrc: Oral  Oral Oral  SpO2: 97% 92% 100% 100%  Weight:       Weight change:   Intake/Output Summary (Last 24 hours) at 10/22/2018 1336 Last data filed at 10/22/2018 0900 Gross per 24 hour  Intake 312 ml  Output 800 ml  Net -488 ml   Physical Exam Constitutional:      General: Baseline cachexia. She is in no acute distress.     Appearance: She is NOT ill-appearing, toxic-appearing, or diaphoretic.  HENT:     Head: Normocephalic and atraumatic.  Eyes:     Extraocular Movements: Extraocular movements intact.  Cardiovascular:     Rate and Rhythm: Bradycardic. Pulmonary:     Effort: Pulmonary effort is normal. Abdominal: Hypoactive bowel sounds, non-tender, non-distended, chronic epigastric hernia Musculoskeletal: Normal range of motion.  Skin:    General: Skin is dry.     Findings: Old echymosis. No new bruises or any other acute changes. Psychiatric:        Affect: Full range (to her baseline)  Assessment/Plan: [name] with medical history significant for multiple recent falls, chronic malnutrition, CKD4, chronic anemia, AFIb, hypothyroidism, HTN, and hearing loss who presented due to a fall and was found to have AKI and acute on chronic anemia.   Chronic Poor Appetite/Malnutrition: Continues to have poor PO intake this hospitalization although slowly improving. Blood glucose in 70-87 over yesterday which has where her BG has been  ranging most of this hospitalization. -Continue to encourage food and feeding supplements -Continue home Vit D, B12, Iron, Folate.   Multiple Recent Falls:  Likely due to chronic dehydration and being on multiple BP lowering meds including HCTZ. Per daughter-in-law, has low BP in 105/60s at home. BP continues to be in normotensive range however latest BP this AM was 94/53 when patient was sitting up.  -Per PT and OT, SNF recommended. Daughter-in-law declined SNF (see SW note from 8/20). -Today, we will have a conversation with Sanda Klein, daughter-in-law, to discuss goals of care and do shared-decision making to determine best next steps to optimize patient's quality of life while reducing risks of future falls and suffering -Voltaren Gel 2g QID & PRN Tylenol for Chronic R Hip Pain -Hold HCTZ -On 8/18, switched from carvedilol to metoprolol for less BP effect as below: continue to hold metop due to continued bradycardia.   Acute Kidney Injury in setting of CKD Stage 4:  Resolved after IV fluids -Cr trend (most recent first): 1.22 > 1.3 > 1.41> 1.74 > 1.93 > 2.07  > 2.18 -Continue strict I/Os, assess volume status, and urine output closely -We will stop daily labs  Persistent AFIB: EF 55-60% in 2009. Continues to be bradycardic in 50s with no symptoms. Tele shows Afib with bradycardia  -Continue to hold metoprolol today due to bradycardia (last Metop dose was 8/19 at 2300) -  Eliquis 2.5mg  BID (s8/18) will continue at discharge if affordable -Continue Cardiac Telemetry  Acute on Chronic Anemia: No evidence of ongoing bleeding, Hgb has stabilized. Macrocytic anemia with folate deficiency, no current iron deficiency (althgough on home supplements), and Retic Index of 0.82 suggestive of inadequate bone marrow response. Hgb continues to improve daily. - Hgb trend (most recent): 9.3 > 8.7 > 8.6 >8.1 > 7.4 > 7.8 > 10.2 - Continue PO home Iron, B12, Folate supplements - Daily CBC   Mild  Hyponatremia: Na of 131 on admission in setting of HCTZ use and chronic poor daily diet. Serum Osm was 279, Urine Na was 90, urine osm 381, both inappropriately high indicating excessive ADH activity. HCTZ and poor nutrition likely contributed as well. -Na continues to be stable at 132 -Hold HCTZ  Thrombocytopenia: mild, appears to be chronic, unchanged. Unclear etiology, possible dx include nutritional deficiency (folate), liver dz, myelodysplastic syndrome, MM. Normal SPEP last year. AST 25, ALT 17, TBilli 1, Albumin 3 (seems to be @ her baseline). Smear shows macrocytic anemia and thrombocytopenia. -Platelet continuing to improve daily- 157 today. -Hold off on further workup given age and comorbidities.  History of Hypothyroidism: TSH 3.62 -Continue home synthroid 14mcg  Hypotension: Although history of HTN, on presentation to ED, had low BP. BP improving. Yesterday, ranged between 104/62-128/78. -Hold home HCTZ and see polypharmacy notes below   Mood Disorder:  -Continue home Lexapro 5mg   Polypharmacy Management summary in setting of advanced age and repeated falls: -d/c'ed HCTZ (low BP and possible contributory factor for low Na -Avoid NSAIDs for pain (nephrotoxic) and home Aspirin (mild baseline thrombocytopenia) -Stopped BB as does not appear to be needed for rate control, having trouble with hypotension, developed bradycardia with metoprolol -Changed Xarelto to Eliquis to lower bruising/bleeding risk -Consider discontinuing Lexapro outpatient   VTE PPX: SCD and Eliquis 2.5mg  BID  DISPOSITION: Medically, patient is progressing well; possible SNF placement afterwards    LOS: 4 days   Faith Holmes, Medical Student 10/22/2018, 1:36 PM

## 2018-10-22 NOTE — Progress Notes (Signed)
Physical Therapy Treatment Patient Details Name: Faith Holmes MRN: [id number] DOB: 1926-12-18 Today's Date: 10/22/2018    History of Present Illness 85 y.o. female presenting after unobserved fall. Daughter-in-law states she found patient lying on the floor near her bed in a conscious state. In the ED, CT Head & Cervical Spine showed focal soft tissue hematoma overlying the LEFT orbital roof. No underlying fracture, acute intracranial abnormality, or hemorrhage. No fracture of cervical spine. COVID test negative. PMH including HTN, HLD, DM II (has not been on any oral medication) diet-controlled, Afib, dCHF, mild aortic stenosis, arthritis, COVID (in June), and Chronic iron deficiency.     PT Comments    Pt was seen for gait after several days with no functional mobility with PT.  Her progress to walking included a close guard of a chair, but is demonstrating better tolerance despite head feeling "swimmy."  Pt is visiting with son who agreed she is not going to be able to go directly home as her family had hoped, and should be pursuing a SNF placement.  Follow acutely as tolerated to continue to be more mobile, and will continue with nursing care to be up in chair.  Contacted nursing to have purwick replaced in the chair.   Follow Up Recommendations  SNF     Equipment Recommendations  Rolling walker with 5" wheels;3in1 (PT)    Recommendations for Other Services       Precautions / Restrictions Precautions Precautions: Fall Restrictions Weight Bearing Restrictions: No    Mobility  Bed Mobility Overal bed mobility: Needs Assistance Bed Mobility: Supine to Sit     Supine to sit: Mod assist     General bed mobility comments: support to get up and to control sit balance  Transfers Overall transfer level: Needs assistance Equipment used: Rolling walker (2 wheeled) Transfers: Sit to/from Stand Sit to Stand: Mod assist;+2 physical assistance;+2 safety/equipment;From elevated  surface Stand pivot transfers: Min assist;+2 physical assistance;+2 safety/equipment       General transfer comment: power up help of 2 mod assist but then can walk with one mod  Ambulation/Gait Ambulation/Gait assistance: Mod assist;+2 physical assistance;+2 safety/equipment Gait Distance (Feet): 30 Feet Assistive device: Rolling walker (2 wheeled);1 person hand held assist Gait Pattern/deviations: Step-through pattern;Decreased stride length;Shuffle;Wide base of support;Trunk flexed Gait velocity: reduced   General Gait Details: pt is "swimmy headedInvestment banker, corporate [employer] (Stroke Patients Only)       Balance Overall balance assessment: Needs assistance Sitting-balance support: No upper extremity supported;Feet supported Sitting balance-Leahy Scale: Fair     Standing balance support: Bilateral upper extremity supported;During functional activity Standing balance-Leahy Scale: Poor                              Cognition Arousal/Alertness: Awake/alert Behavior During Therapy: Flat affect Overall Cognitive Status: Impaired/Different from baseline Area of Impairment: Following commands;Safety/judgement;Awareness;Problem solving                   Current Attention Level: Selective Memory: Decreased recall of precautions;Decreased short-term memory Following Commands: Follows one step commands inconsistently;Follows one step commands with increased time Safety/Judgement: Decreased awareness of safety;Decreased awareness of deficits Awareness: Intellectual Problem Solving: Slow processing;Requires verbal cues;Requires tactile cues General Comments: pt is very Dietitian Comments General  comments (skin integrity, edema, etc.): ptis up to side of bed then standing with help but needs two person help for safety.  Pt is up in chair and in pain due to her skin breakdown, son in to realize  she is lacking independence for home.      Pertinent Vitals/Pain Pain Assessment: Faces Faces Pain Scale: Hurts little more Pain Location: bottom on the chair Pain Descriptors / Indicators: Grimacing Pain Intervention(s): Limited activity within patient's tolerance;Monitored during session;Repositioned    Home Living Family/patient expects to be discharged to:: Private residence Living Arrangements: Children Available Help at Discharge: Family;Available 24 hours/day Type of Home: House              Prior Function            PT Goals (current goals can now be found in the care plan section) Acute Rehab PT Goals Patient Stated Goal: none given Progress towards PT goals: Progressing toward goals    Frequency    Min 3X/week      PT Plan Current plan remains appropriate    Co-evaluation   Reason for Co-Treatment: For patient/therapist safety          AM-PAC PT "6 Clicks" Mobility   Outcome Measure  Help needed turning from your back to your side while in a flat bed without using bedrails?: A Little Help needed moving from lying on your back to sitting on the side of a flat bed without using bedrails?: A Lot Help needed moving to and from a bed to a chair (including a wheelchair)?: A Lot Help needed standing up from a chair using your arms (e.g., wheelchair or bedside chair)?: A Lot Help needed to walk in hospital room?: A Lot Help needed climbing 3-5 steps with a railing? : Total 6 Click Score: 12    End of Session Equipment Utilized During Treatment: Gait belt Activity Tolerance: Patient limited by fatigue;Treatment limited secondary to medical complications (Comment)(swimmy head with all standing) Patient left: in chair;with call bell/phone within reach;with chair alarm set;with family/visitor present;with nursing/sitter in room Nurse Communication: Mobility status PT Visit Diagnosis: Muscle weakness (generalized) (M62.81);Difficulty in walking, not  elsewhere classified (R26.2);Adult, failure to thrive (R62.7)     Time: [id number] PT Time Calculation (min) (ACUTE ONLY): 40 min  Charges:  $Gait Training: 8-22 mins $Therapeutic Activity: 8-22 mins                    SUGEY DEEMS 10/22/2018, 6:18 PM   Mee Hives, PT MS Acute Rehab Dept. Number: [place]

## 2018-10-22 NOTE — TOC Benefit Eligibility Note (Signed)
Transition of Care [facility]) Benefit Eligibility Note    Patient Details  Name: Faith Holmes MRN: [id number] Date of Birth: [birth year]   Medication/Dose: Eliquis 46m 2x day for 30days  Covered?: Yes  Tier: 3 Drug  Prescription Coverage [facility]  Spoke with Person/Company/Phone Number:: Melissa/ [facility] [employer] [phone] Co-Pay: $47.00  Prior Approval: Yes(prior aIBBC [phone]  Deductible: Met       FKerin SalenPhone Number: 10/22/2018, 12:13 PM

## 2018-10-23 NOTE — Progress Notes (Signed)
  Date: 10/23/2018  Patient name: Faith Holmes  Medical record number: [id number]  Date of birth: Jul 10, 1926   I have seen and evaluated this patient and I have discussed the plan of care with the house staff. Please see their note for complete details. I concur with their findings.  Possible discharge home tomorrow.  Lenice Pressman, M.D., Ph.D. 10/23/2018, 6:41 PM

## 2018-10-23 NOTE — Progress Notes (Signed)
Subjective:  Pt reports feeling well, no pain or dyspnea.    Objective:  Vital signs in last 24 hours: Vitals:   10/22/18 1008 10/22/18 1629 10/22/18 2027 10/23/18 0502  BP: (!) 94/53 122/68 (!) 116/55 (!) 130/100  Pulse: (!) 51 (!) 59 63 (!) 59  Resp: 14 16 18 18   Temp: 97.9 F (36.6 C) 98.1 F (36.7 C) 98.1 F (36.7 C) 98 F (36.7 C)  TempSrc: Oral Oral Oral Oral  SpO2: 100% 100% 93% 100%  Weight:    46.7 kg   Weight change:   Intake/Output Summary (Last 24 hours) at 10/23/2018 0730 Last data filed at 10/23/2018 0600 Gross per 24 hour  Intake 295 ml  Output 400 ml  Net -105 ml   Cardiac:  Irregularly irregular, clear s1 and s2, no murmurs, rubs or gallops, no LE edema Pulmonary: CTAB, not in distress Abdominal: non distended abdomen, soft and nontender Psych: Alert, conversant, in good spirits  Telemetry: majority Afib with rate of 60-80, few episodes during the night of slow afib around 50 bpm   Assessment/Plan: [name] with medical history significant for multiple recent falls, chronic malnutrition, CKD4, chronic anemia, AFIb, hypothyroidism, HTN, and hearing loss who presented due to a fall and was found to have AKI and acute on chronic anemia.   Chronic Poor Appetite/Malnutrition: Continues to have poor PO intake this hospitalization although slowly improving. Blood glucose in 70-87 over yesterday which has where her BG has been ranging most of this hospitalization. -Continue to encourage food and feeding supplements, seems to be drinking ensures and doing a bit better with oral intake -Continue home Vit D, B12, Iron, added Folate.   Multiple Recent Falls:  Likely due to chronic dehydration and being on multiple BP lowering meds including HCTZ which have now been held and patient has been hydrated. Per PT and OT, SNF recommended. Daughter-in-law declined SNF (see SW note from 8/20).  Family still refusing SNF and say they need another day to get things ready at  home and have hired a home health aide.  -On 8/18, switched from carvedilol to metoprolol for less BP effect as below: continue to hold metop due to continued bradycardia.   Acute Kidney Injury in setting of CKD Stage 4:  Resolved after IV fluids.  Cr trend (most recent first): 1.22 > 1.3 > 1.41> 1.74 > 1.93 > 2.07  > 2.18 -Continue strict I/Os, assess volume status, and urine output closely -We will stop daily labs  Persistent AFIB: EF 55-60% in 2009. Continues to be bradycardic in 50s with no symptoms. Tele shows Afib with bradycardia  -Continue to hold metoprolol due to bradycardia (last Metop dose was 8/19 at 2300) -Eliquis 2.5mg  BID (s8/18) will continue at discharge if affordable -Continue Cardiac Telemetry  Acute on Chronic Anemia: No evidence of ongoing bleeding, Hgb has stabilized. Macrocytic anemia with folate deficiency, no current iron deficiency (althgough on home supplements), and Retic Index of 0.82 suggestive of inadequate bone marrow response. Hgb continues to improve daily. - Hgb trend (most recent): 9.3 > 8.7 > 8.6 >8.1 > 7.4 > 7.8 > 10.2 - Continue PO home Iron, B12, added Folate supplements  Thrombocytopenia: mild, appears to be chronic, unchanged. Unclear etiology, possible dx include nutritional deficiency (folate), liver dz, myelodysplastic syndrome, MM. Normal SPEP last year. AST 25, ALT 17, TBilli 1, Albumin 3 (seems to be @ her baseline). 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 -Platelet count improving with improvement in  nutrition and supplements -Hold off on further workup given age and comorbidities.  History of Hypothyroidism: TSH 3.62 -Continue home synthroid 58mcg  Hypotension: Although history of HTN, on presentation to ED, had low BP. BP improving. Yesterday, ranged between 104/62-128/78. -Hold home HCTZ and see polypharmacy notes below   Mood Disorder:  -Continue home Lexapro 5mg    VTE PPX:  Eliquis 2.5mg  BID   DISPOSITION: Medically, patient is progressing well; possible SNF placement afterwards    LOS: 5 days   Katherine Roan, MD 10/23/2018, 7:30 AM

## 2018-10-23 NOTE — Plan of Care (Signed)
  Problem: Nutrition: Goal: Adequate nutrition will be maintained Outcome: Progressing   

## 2018-10-24 MED ORDER — ENSURE ENLIVE PO LIQD: 237.0000 mL | Freq: Two times a day (BID) | ORAL | 12 refills | Status: AC

## 2018-10-24 MED ORDER — APIXABAN 2.5 MG PO TABS: 2.5000 mg | ORAL_TABLET | Freq: Two times a day (BID) | ORAL | 0 refills | Status: AC

## 2018-10-24 MED ORDER — FOLIC ACID 1 MG PO TABS: 1.0000 mg | ORAL_TABLET | Freq: Every day | ORAL | 0 refills | Status: AC

## 2018-10-24 NOTE — Progress Notes (Signed)
Subjective:  Pt reports feeling well, no pain or dyspnea.  She ate more of her breakfast than usual and reports she made a conscious effort to do so.    Objective:  Vital signs in last 24 hours: Vitals:   10/23/18 1800 10/23/18 2114 10/24/18 0238 10/24/18 0900  BP: 115/70 129/68  120/65  Pulse: 74 68  71  Resp: 16 16  16   Temp: 98 F (36.7 C) 98.5 F (36.9 C)  98 F (36.7 C)  TempSrc: Oral Oral  Oral  SpO2: 100% 99%  100%  Weight:   46.5 kg    Weight change: -0.249 kg  Intake/Output Summary (Last 24 hours) at 10/24/2018 1040 Last data filed at 10/24/2018 0900 Gross per 24 hour  Intake 597 ml  Output 200 ml  Net 397 ml   Cardiac:  Irregularly irregular, clear s1 and s2, no murmurs, rubs or gallops, no LE edema Pulmonary: CTAB, not in distress Abdominal: non distended abdomen, soft and nontender Psych: Alert, conversant, in good spirits  Telemetry: majority Afib with rate of 60-80, few episodes during the night of slow afib around 50 bpm   Assessment/Plan: [name] with medical history significant for multiple recent falls, chronic malnutrition, CKD4, chronic anemia, AFIb, hypothyroidism, HTN, and hearing loss who presented due to a fall and was found to have AKI and acute on chronic anemia.   Chronic Poor Appetite/Malnutrition: Continues to have poor PO intake this hospitalization although slowly improving. Blood glucose in 70-87 over yesterday which has where her BG has been ranging most of this hospitalization. -Continue to encourage food and feeding supplements, seems to be drinking ensures and doing a bit better with oral intake -Continue home Vit D, B12, Iron, added Folate.   Multiple Recent Falls:  Likely due to chronic dehydration and being on multiple BP lowering meds including HCTZ which have now been held and patient has been hydrated. Per PT and OT, SNF recommended. Daughter-in-law declined SNF (see SW note from 8/20).  Family still refusing SNF and say they need  another day to get things ready at home and have hired a home health aide.  -On 8/18, switched from carvedilol to metoprolol for less BP effect as below: continue to hold metop due to continued bradycardia. Will hold on d/c  Acute Kidney Injury in setting of CKD Stage 4:  Resolved after IV fluids.  Cr trend (most recent first): 1.22 > 1.3 > 1.41> 1.74 > 1.93 > 2.07  > 2.18 -Continue strict I/Os, assess volume status, and urine output closely -We will stop daily labs  Persistent AFIB: EF 55-60% in 2009. Continues to be bradycardic in 50s with no symptoms. Tele shows Afib with bradycardia  -Continue to hold metoprolol due to bradycardia (last Metop dose was 8/19 at 2300) -Eliquis 2.5mg  BID (s8/18) will continue at discharge  -Continue Cardiac Telemetry  Acute on Chronic Anemia: No evidence of ongoing bleeding, Hgb has stabilized. Macrocytic anemia with folate deficiency, no current iron deficiency (althgough on home supplements), and Retic Index of 0.82 suggestive of inadequate bone marrow response. Hgb continues to improve daily. - Hgb trend (most recent): 9.3 > 8.7 > 8.6 >8.1 > 7.4 > 7.8 > 10.2 - Continue PO home Iron, B12, added Folate supplements  Thrombocytopenia: mild, appears to be chronic, unchanged. Unclear etiology, possible dx include nutritional deficiency (folate), liver dz, myelodysplastic syndrome, MM. Normal SPEP last year. AST 25, ALT 17, TBilli 1, Albumin 3 (seems to be @ her baseline). Smear  shows a megaloblastic macrocytic anemia and thrombocytopenia. -Platelet count improving with improvement in nutrition and supplements -Hold off on further workup given age and comorbidities.  History of Hypothyroidism: TSH 3.62 -Continue home synthroid 24mcg  Hypotension: Although history of HTN, on presentation to ED, had low BP. BP improving. Yesterday, ranged between 104/62-128/78. -Hold home HCTZ    Mood Disorder:  -Continue home Lexapro 5mg    VTE PPX:  Eliquis 2.5mg  BID   DISPOSITION: Medically, patient is progressing well d/c home with home health PT/OT, home health aide    LOS: 6 days   Katherine Roan, MD 10/24/2018, 10:40 AM

## 2018-10-24 NOTE — Progress Notes (Addendum)
DISCHARGE NOTE HOME [address] to be discharged Home per MD order. Discussed prescriptions and follow up appointments with the patient and daughter in law Proofreader).  Prescriptions given to patient; medication list explained in detail. Patient verbalized understanding.  Skin clean, dry and intact without evidence of skin break down, no evidence of skin tears noted. IV catheter discontinued intact. Site without signs and symptoms of complications. Dressing and pressure applied. Pt denies pain at the site currently. No complaints noted.  Patient free of lines, drains, and wounds.   An After Visit Summary (AVS) was printed and given to the patient. Patient escorted via wheelchair, and discharged home via private auto.  Paulla Fore, RN

## 2018-10-24 NOTE — TOC Transition Note (Signed)
Transition of Care [facility]) - CM/SW Discharge Note   Patient Details  Name: Faith Holmes MRN: [id number] Date of Birth: 02-Apr-1926  [facility] CM/SW Contact:  Claudie Leach, RN Phone Number: 10/24/2018, 12:54 PM   Clinical Narrative:    Spoke with patient's daughter regarding d/c plans.  HH previously arranged with [facility].  Patient has RW and 3n1 at home. Eliquis 30 day free card left with RN to give with d/c papers.     Final next level of care: [place] Barriers to Discharge: Continued Medical Work up   Patient Goals and CMS Choice Patient states their goals for this hospitalization and ongoing recovery are:: Daughter-in-law [place] Vienneau expresed that she wants patient to come home once ready for discharge so that family can continue to care for her with [facility] service,.3. [place] CMS Medicare.gov Compare Post Acute Care list provided to:: Patient Represenative (must comment)(Home Health list provided to Ms. Sanda Klein - daughter-in-law. She also requested to keep SNF list) Choice offered to / list presented to : Adult Children(Daughter-in-law Sanda Klein)   Discharge Plan and Services In-house Referral: Clinical Social Work   Post Acute Care Choice: Home Health

## 2018-11-15 ENCOUNTER — Other Ambulatory Visit: Payer: Self-pay | Admitting: Internal Medicine

## 2019-06-17 ENCOUNTER — Other Ambulatory Visit: Payer: Self-pay

## 2019-06-17 ENCOUNTER — Emergency Department (HOSPITAL_COMMUNITY): Payer: Medicare HMO

## 2019-06-17 ENCOUNTER — Encounter (HOSPITAL_COMMUNITY): Payer: Self-pay | Admitting: Emergency Medicine

## 2019-06-17 ENCOUNTER — Emergency Department (HOSPITAL_COMMUNITY)
Admission: EM | Admit: 2019-06-17 | Discharge: 2019-06-17 | Disposition: A | Discharge status: Home / Self Care | Payer: Medicare HMO | Attending: Emergency Medicine | Admitting: Emergency Medicine

## 2019-06-17 DIAGNOSIS — E119 Type 2 diabetes mellitus without complications: Secondary | ICD-10-CM | POA: Insufficient documentation

## 2019-06-17 DIAGNOSIS — R531 Weakness: Secondary | ICD-10-CM | POA: Diagnosis present

## 2019-06-17 DIAGNOSIS — Z7901 Long term (current) use of anticoagulants: Secondary | ICD-10-CM | POA: Diagnosis not present

## 2019-06-17 DIAGNOSIS — I110 Hypertensive heart disease with heart failure: Secondary | ICD-10-CM | POA: Insufficient documentation

## 2019-06-17 DIAGNOSIS — F0390 Unspecified dementia without behavioral disturbance: Secondary | ICD-10-CM | POA: Diagnosis not present

## 2019-06-17 DIAGNOSIS — I5030 Unspecified diastolic (congestive) heart failure: Secondary | ICD-10-CM | POA: Diagnosis not present

## 2019-06-17 DIAGNOSIS — Z79899 Other long term (current) drug therapy: Secondary | ICD-10-CM | POA: Insufficient documentation

## 2019-06-17 DIAGNOSIS — I2510 Atherosclerotic heart disease of native coronary artery without angina pectoris: Secondary | ICD-10-CM | POA: Insufficient documentation

## 2019-06-17 DIAGNOSIS — Z8616 Personal history of COVID-19: Secondary | ICD-10-CM | POA: Insufficient documentation

## 2019-06-17 DIAGNOSIS — E039 Hypothyroidism, unspecified: Secondary | ICD-10-CM | POA: Diagnosis not present

## 2019-06-17 LAB — CBC WITH DIFFERENTIAL/PLATELET
Abs Immature Granulocytes: 0.02 10*3/uL (ref 0.00–0.07)
Basophils Absolute: 0 10*3/uL (ref 0.0–0.1)
Basophils Relative: 1 %
Eosinophils Absolute: 0.1 10*3/uL (ref 0.0–0.5)
Eosinophils Relative: 3 %
HCT: 42.8 % (ref 36.0–46.0)
Hemoglobin: 12.9 g/dL (ref 12.0–15.0)
Immature Granulocytes: 0 %
Lymphocytes Relative: 17 %
Lymphs Abs: 0.8 10*3/uL (ref 0.7–4.0)
MCH: 31.5 pg (ref 26.0–34.0)
MCHC: 30.1 g/dL (ref 30.0–36.0)
MCV: 104.4 fL — ABNORMAL HIGH (ref 80.0–100.0)
Monocytes Absolute: 0.3 10*3/uL (ref 0.1–1.0)
Monocytes Relative: 6 %
Neutro Abs: 3.4 10*3/uL (ref 1.7–7.7)
Neutrophils Relative %: 73 %
Platelets: 113 10*3/uL — ABNORMAL LOW (ref 150–400)
RBC: 4.1 MIL/uL (ref 3.87–5.11)
RDW: 13.9 % (ref 11.5–15.5)
WBC: 4.7 10*3/uL (ref 4.0–10.5)
nRBC: 0 % (ref 0.0–0.2)

## 2019-06-17 LAB — CBG MONITORING, ED: Glucose-Capillary: 68 mg/dL — ABNORMAL LOW (ref 70–99)

## 2019-06-17 LAB — BRAIN NATRIURETIC PEPTIDE: B Natriuretic Peptide: 1184.2 pg/mL — ABNORMAL HIGH (ref 0.0–100.0)

## 2019-06-17 LAB — URINALYSIS, ROUTINE W REFLEX MICROSCOPIC
Bacteria, UA: NONE SEEN
Bilirubin Urine: NEGATIVE
Glucose, UA: NEGATIVE mg/dL
Hgb urine dipstick: NEGATIVE
Ketones, ur: NEGATIVE mg/dL
Leukocytes,Ua: NEGATIVE
Nitrite: NEGATIVE
Protein, ur: >=300 mg/dL — AB
Specific Gravity, Urine: 1.026 (ref 1.005–1.030)
pH: 5 (ref 5.0–8.0)

## 2019-06-17 LAB — COMPREHENSIVE METABOLIC PANEL
ALT: 11 U/L (ref 0–44)
AST: 17 U/L (ref 15–41)
Albumin: 3.9 g/dL (ref 3.5–5.0)
Alkaline Phosphatase: 57 U/L (ref 38–126)
Anion gap: 10 (ref 5–15)
BUN: 31 mg/dL — ABNORMAL HIGH (ref 8–23)
CO2: 29 mmol/L (ref 22–32)
Calcium: 9 mg/dL (ref 8.9–10.3)
Chloride: 104 mmol/L (ref 98–111)
Creatinine, Ser: 1.6 mg/dL — ABNORMAL HIGH (ref 0.44–1.00)
GFR calc Af Amer: 33 mL/min — ABNORMAL LOW (ref >60)
GFR calc non Af Amer: 28 mL/min — ABNORMAL LOW (ref >60)
Glucose, Bld: 88 mg/dL (ref 70–99)
Potassium: 4.1 mmol/L (ref 3.5–5.1)
Sodium: 143 mmol/L (ref 135–145)
Total Bilirubin: 0.5 mg/dL (ref 0.3–1.2)
Total Protein: 6.4 g/dL — ABNORMAL LOW (ref 6.5–8.1)

## 2019-06-17 LAB — LIPASE, BLOOD: Lipase: 29 U/L (ref 11–51)

## 2019-06-17 MED ORDER — SODIUM CHLORIDE 0.9 % IV BOLUS
500.0000 mL | Freq: Once | INTRAVENOUS | Status: AC
  Administered 2019-06-17: 17:00:00 500 mL via INTRAVENOUS

## 2019-06-17 MED ORDER — DEXTROSE-NACL 5-0.45 % IV SOLN: INTRAVENOUS | Status: DC

## 2019-06-17 NOTE — ED Notes (Signed)
Pt transported to MRI 

## 2019-06-17 NOTE — ED Provider Notes (Addendum)
[facility] DEPT Provider Note   CSN: [id number] Arrival date & time: 06/17/19  1536     History Chief Complaint  Patient presents with  . Weakness    Faith Holmes is a 86 y.o. female.  Patient brought in by EMS they state they were called out to the house for weakness for the past few days with decreased oral intake.  Her blood sugar was 71 there.  Patient's family stated that patient was alert to self and that she has a history of dementia but she was baseline as far as all that was concerned.  Patient is on Eliquis for atrial fib.  There is been no falls.  We then got report that the patient's granddaughter thought that there was a facial droop.  Were not sure what side.  But upon arrival here there was no evidence of facial droop.  Patient shortly after she arrived was put on 3 L of oxygen for hypoxia.  We had her off regular oxygen she not normally on any oxygen.  And oxygen sats have been in the low 90s.        Past Medical History:  Diagnosis Date  . Arthritis   . Atrial fibrillation ([place])   . Diabetes mellitus ([place])   . Diastolic dysfunction   . 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 Gout   . Hard of hearing   . High cholesterol   . Hypertension   . Iron deficiency anemia 07/01/2017  . 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 Mild aortic stenosis   . Thrombocytopenia ([place])   . Thyroid disease    hypothyroidism    Patient Active Problem List   Diagnosis Date Noted  . Hyponatremia 10/20/2018  . Thrombocytopenia ([place]) 10/20/2018  . AKI (acute kidney injury) ([place]) 10/17/2018  . Hyperkalemia 08/23/2018  . ARF (acute renal failure) ([place]) 08/23/2018  . COVID-19 virus infection 08/23/2018  . Hypotension 08/23/2018  . Dehydration 08/23/2018  . HTN (hypertension) 08/23/2018  . Persistent atrial fibrillation ([place]) 08/23/2018  . HLD (hyperlipidemia) 08/23/2018  . Hypothyroidism 08/23/2018  . Acute on chronic diastolic CHF (congestive heart  failure) ([place]) 08/23/2018  . Iron deficiency anemia 07/01/2017  . 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 DOPPLER ECHOCARDIOGRAPHY     mild diastolic dysfuntion, trivial mitral and tricuspid valve regurgitation  . Mild-moderate AV stenosis       OB History   No obstetric history on file.     No family history on file.  Social History   Tobacco Use  . Smoking status: Never Smoker  . Smokeless tobacco: Never Used  Substance Use Topics  . Alcohol use: Not Currently  . Drug use: Not on file    Home Medications Prior to Admission medications   Medication Sig Start Date End Date Taking? Authorizing Provider  apixaban (ELIQUIS) 2.5 MG TABS tablet Take 1 tablet (2.5 mg total) by mouth 2 (two) times daily. 10/24/18  Yes Katherine Roan, MD  Cholecalciferol (VITAMIN D3) 1000 units CAPS Take 1,000 Units by mouth daily.   Yes [provider]  cyanocobalamin 100 MCG tablet Take 100 mcg by mouth daily.    Yes [provider]  iron polysaccharides (NU-IRON) 150 MG capsule Take 150 mg by mouth at bedtime.    Yes [provider]  levothyroxine (SYNTHROID) 88 MCG tablet Take 88 mcg by mouth daily. 05/10/19  Yes [provider]  memantine (NAMENDA) 5 MG tablet Take 5 mg by mouth 2 (two) times daily. 05/18/19  Yes [provider]  traZODone (DESYREL) 50 MG tablet Take 75 mg by mouth at bedtime. 05/27/19  Yes [provider]  feeding supplement, ENSURE ENLIVE, (ENSURE ENLIVE) LIQD Take 237 mLs by mouth 2 (two) times daily between meals. Patient not taking: Reported on 06/17/2019 10/24/18   Katherine Roan, MD    Allergies    Codeine, Furosemide, Oxaprozin, and Ramipril  Review of Systems   Review of Systems  Unable to perform ROS: Dementia    Physical Exam Updated Vital Signs BP (!) 156/114   Pulse (!) 54   Temp 97.6 F (36.4 C) (Oral)    Resp 20   SpO2 93%   Physical Exam Vitals and nursing note reviewed.  Constitutional:      General: She is not in acute distress.    Appearance: Normal appearance. She is well-developed.  HENT:     Head: Normocephalic and atraumatic.     Mouth/Throat:     Mouth: Mucous membranes are dry.  Eyes:     Extraocular Movements: Extraocular movements intact.     Conjunctiva/sclera: Conjunctivae normal.     Pupils: Pupils are equal, round, and reactive to light.  Cardiovascular:     Rate and Rhythm: Normal rate and regular rhythm.     Heart sounds: No murmur.  Pulmonary:     Effort: Pulmonary effort is normal. No respiratory distress.     Breath sounds: Normal breath sounds.  Abdominal:     Palpations: Abdomen is soft.     Tenderness: There is no abdominal tenderness.  Musculoskeletal:        General: No swelling. Normal range of motion.     Cervical back: Normal range of motion and neck supple.  Skin:    General: Skin is warm and dry.     Capillary Refill: Capillary refill takes less than 2 seconds.  Neurological:     General: No focal deficit present.     Mental Status: She is alert. Mental status is at baseline.     Cranial Nerves: No cranial nerve deficit.     Sensory: No sensory deficit.     Motor: No weakness.     Comments: No evidence of any facial droop.  No significant other focal findings.  Patient is mental status baseline with her dementia according to her son.  Patient very alert.  Very talkative.     ED Results / Procedures / Treatments   Labs (all labs ordered are listed, but only abnormal results are displayed) Labs Reviewed  COMPREHENSIVE METABOLIC PANEL - Abnormal; Notable for the following components:      Result Value   BUN 31 (*)    Creatinine, Ser 1.60 (*)    Total Protein 6.4 (*)    GFR calc non Af Amer 28 (*)    GFR calc Af Amer 33 (*)    All other components within normal limits  CBC WITH DIFFERENTIAL/PLATELET - Abnormal; Notable for the  following components:   MCV 104.4 (*)    Platelets 113 (*)    All other components within normal limits  URINALYSIS, ROUTINE W REFLEX MICROSCOPIC - Abnormal; Notable for the following components:   Protein, ur >=300 (*)    All other components within normal limits  BRAIN NATRIURETIC PEPTIDE - Abnormal; Notable for the following components:   B Natriuretic Peptide 1,184.2 (*)    All other components within normal  limits  CBG MONITORING, ED - Abnormal; Notable for the following components:   Glucose-Capillary 68 (*)    All other components within normal limits  LIPASE, BLOOD    EKG EKG Interpretation  Date/Time:  Friday June 17 2019 16:16:37 EDT Ventricular Rate:  84 PR Interval:    QRS Duration: 78 QT Interval:  542 QTC Calculation: 637 R Axis:   13 Text Interpretation: Atrial fibrillation Low voltage, extremity leads Nonspecific T abnrm, anterolateral leads Prolonged QT interval Confirmed by Fredia Sorrow [phone]) on 06/17/2019 4:56:50 PM   Radiology MR Brain Wo Contrast (neuro protocol)  Result Date: 06/17/2019 CLINICAL DATA:  TIA. Weakness for the last 3 days. Left-sided facial droop. EXAM: MRI HEAD WITHOUT CONTRAST TECHNIQUE: Multiplanar, multiecho pulse sequences of the brain and surrounding structures were obtained without intravenous contrast. COMPARISON:  CT head without contrast 10/17/2018 FINDINGS: Brain: No acute infarct, hemorrhage, or mass lesion is present. The ventricles are of normal size. No significant extraaxial fluid collection is present. Moderate atrophy and diffuse confluent white matter changes are present bilaterally. Dilated perivascular spaces are present within the basal ganglia. White matter changes extend into the brainstem. Remote nonhemorrhagic lacunar infarcts are present within the right cerebellum. Vascular: Flow is present in the major intracranial arteries. Skull and upper cervical spine: The craniocervical junction is normal. Upper cervical spine  is within normal limits. Marrow signal is unremarkable. Sinuses/Orbits: The paranasal sinuses and mastoid air cells are clear. The globes and orbits are within normal limits. IMPRESSION: 1. No acute intracranial abnormality. 2. Moderate atrophy and diffuse white matter disease likely reflects the sequela of chronic microvascular ischemia. 3. Remote nonhemorrhagic lacunar infarcts of the right cerebellum. Electronically Signed   By: San Morelle M.D.   On: 06/17/2019 20:24   DG Chest Port 1 View  Result Date: 06/17/2019 CLINICAL DATA:  Weakness. EXAM: PORTABLE CHEST 1 VIEW COMPARISON:  October 17, 2018. FINDINGS: Stable cardiomegaly. No pneumothorax is noted. Small left pleural effusion is noted with associated subsegmental atelectasis. Mild right basilar atelectasis is noted. Bony thorax is unremarkable. Atherosclerosis of thoracic aorta is noted. IMPRESSION: Small left pleural effusion is noted with associated subsegmental atelectasis. Mild right basilar atelectasis is noted. Aortic Atherosclerosis (ICD10-I70.0). Electronically Signed   By: Marijo Conception M.D.   On: 06/17/2019 16:49    Procedures Procedures (including critical care time)  Medications Ordered in ED Medications  dextrose 5 %-0.45 % sodium chloride infusion ( Intravenous Stopped 06/17/19 2136)  sodium chloride 0.9 % bolus 500 mL (0 mLs Intravenous Stopped 06/17/19 1809)    ED Course  I have reviewed the triage vital signs and the nursing notes.  Pertinent labs & imaging results that were available during my care of the patient were reviewed by me and considered in my medical decision making (see chart for details).    MDM Rules/Calculators/A&P                      EKG showed evidence of a rate controlled atrial fib.  Patient known to have that diagnosis and she is on Eliquis.  Patient here without any focal findings.  But when on did MRI brain because of the concern for the facial droop.  No evidence of any acute  findings.  There is evidence of old infarct.  Chest x-ray without any significant findings other than a small pleural effusion.  Patient's BNP was elevated but no frank evidence of CHF on the chest x-ray.  And also patient  without any respiratory distress.  And on room air oxygen patient's oxygen sats have been in the low 90s consistently.  Patient very alert very talkative in no distress.  Labs otherwise without any significant changes.  There was no history of any chest pain or any concern for that.  Patient's initial blood sugar here was 71.  It is maintained in that general ballpark.  No evidence of any significant hypoglycemia.  Patient's son was here and felt that she was very much her baseline.  With work-up being negative other than the concern for the elevated BNP will have her follow-up with cardiology have her follow back up with her primary care doctor for a little bit worsening renal function than her baseline.  Otherwise patient stable for discharge home and patient's family son was fine with that.    Final Clinical Impression(s) / ED Diagnoses Final diagnoses:  Weakness  Dementia without behavioral disturbance, unspecified dementia type [facility])    Rx / DC Orders ED Discharge Orders    None       Fredia Sorrow, MD 06/17/19 2216    Fredia Sorrow, MD 06/17/19 2232

## 2019-06-17 NOTE — ED Notes (Signed)
Pure wick has been placed. Suction set to 45mmHg.  

## 2019-06-17 NOTE — Discharge Instructions (Addendum)
Work-up for weakness.  There was some concern about facial droop.  None of that was present on exam here.  MRI was done which was negative.  Patient's oxygen levels on room air are in the low 90s.  Chest x-ray without any acute findings.  But BNP with marked elevation suggested there may be a component of congestive heart failure.  Patient stable for discharge home follow-up with primary care doctor and follow-up with cardiology.  Will need to call for an appointment.  Return for any new or worse symptoms.  Also would have primary care doctor follow-up on her kidney function.  It is a little bit worse today than it has been in the past.

## 2019-06-17 NOTE — ED Triage Notes (Signed)
Per [facility] pt from home for weakness over the past 3 days. Pt take Eliquis for her a fib and has multiple bruising to arms. No recent falls per family. Pt normally drinks 6-8 glasses water per day, now only drinking around 3 glasses per day. CBG 71. Pt alert to self and at her normal baseline per family.  22g in left hand.

## 2019-06-17 NOTE — ED Triage Notes (Signed)
Fire put patient on 3L oxygen due to hypoxia.

## 2019-06-17 NOTE — ED Notes (Signed)
ED Provider at bedside. 

## 2019-06-17 NOTE — ED Notes (Signed)
X-ray at bedside

## 2019-06-17 NOTE — ED Notes (Signed)
Son called to pick pt up for discharge

## 2019-06-17 NOTE — ED Notes (Signed)
Pt's son reports that his sister in law noticed pt had left sided facial droop before they called EMS. States they family was concerned about stroke. Made Dr Rogene [place] aware.

## 2019-06-21 ENCOUNTER — Other Ambulatory Visit: Payer: Self-pay | Admitting: Family

## 2019-08-02 DEATH — deceased

## 2020-06-20 IMAGING — DX PELVIS - 1-2 VIEW
1 series · 1 of 1 positions shown · non-contrast
Comparison: 02/07/2009

CLINICAL DATA: Recent fall with hip pain, initial encounter

EXAM:
PELVIS - 1-2 VIEW

[pelvis ap]
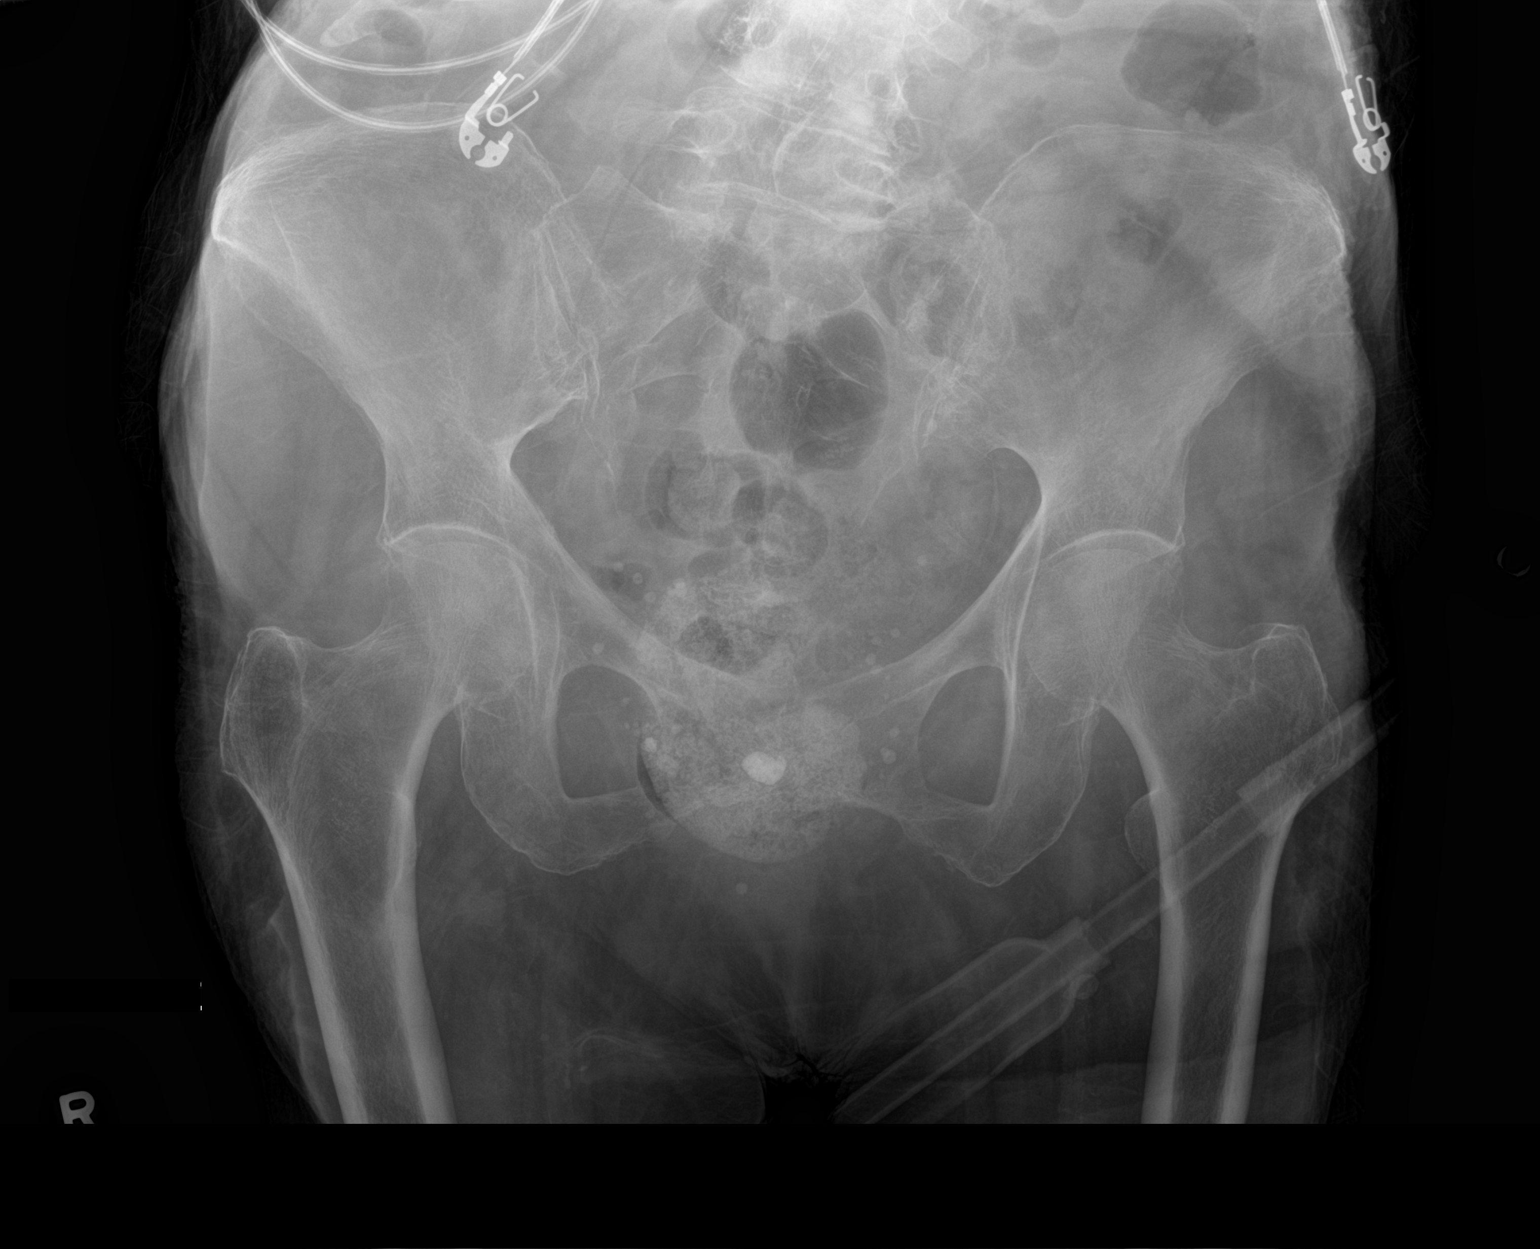

[1 of 1 positions shown; findings below may reference images not displayed]

FINDINGS: There is no evidence of pelvic fracture or diastasis. No pelvic bone
lesions are seen.
IMPRESSION: No acute abnormality noted.

## 2020-06-20 IMAGING — DX RIGHT KNEE - 1-2 VIEW
2 series · 2 of 2 positions shown · non-contrast
Comparison: 11/27/2008

CLINICAL DATA: Recent fall with right knee pain, initial encounter

EXAM:
RIGHT KNEE - 2 VIEW

[knee ap]
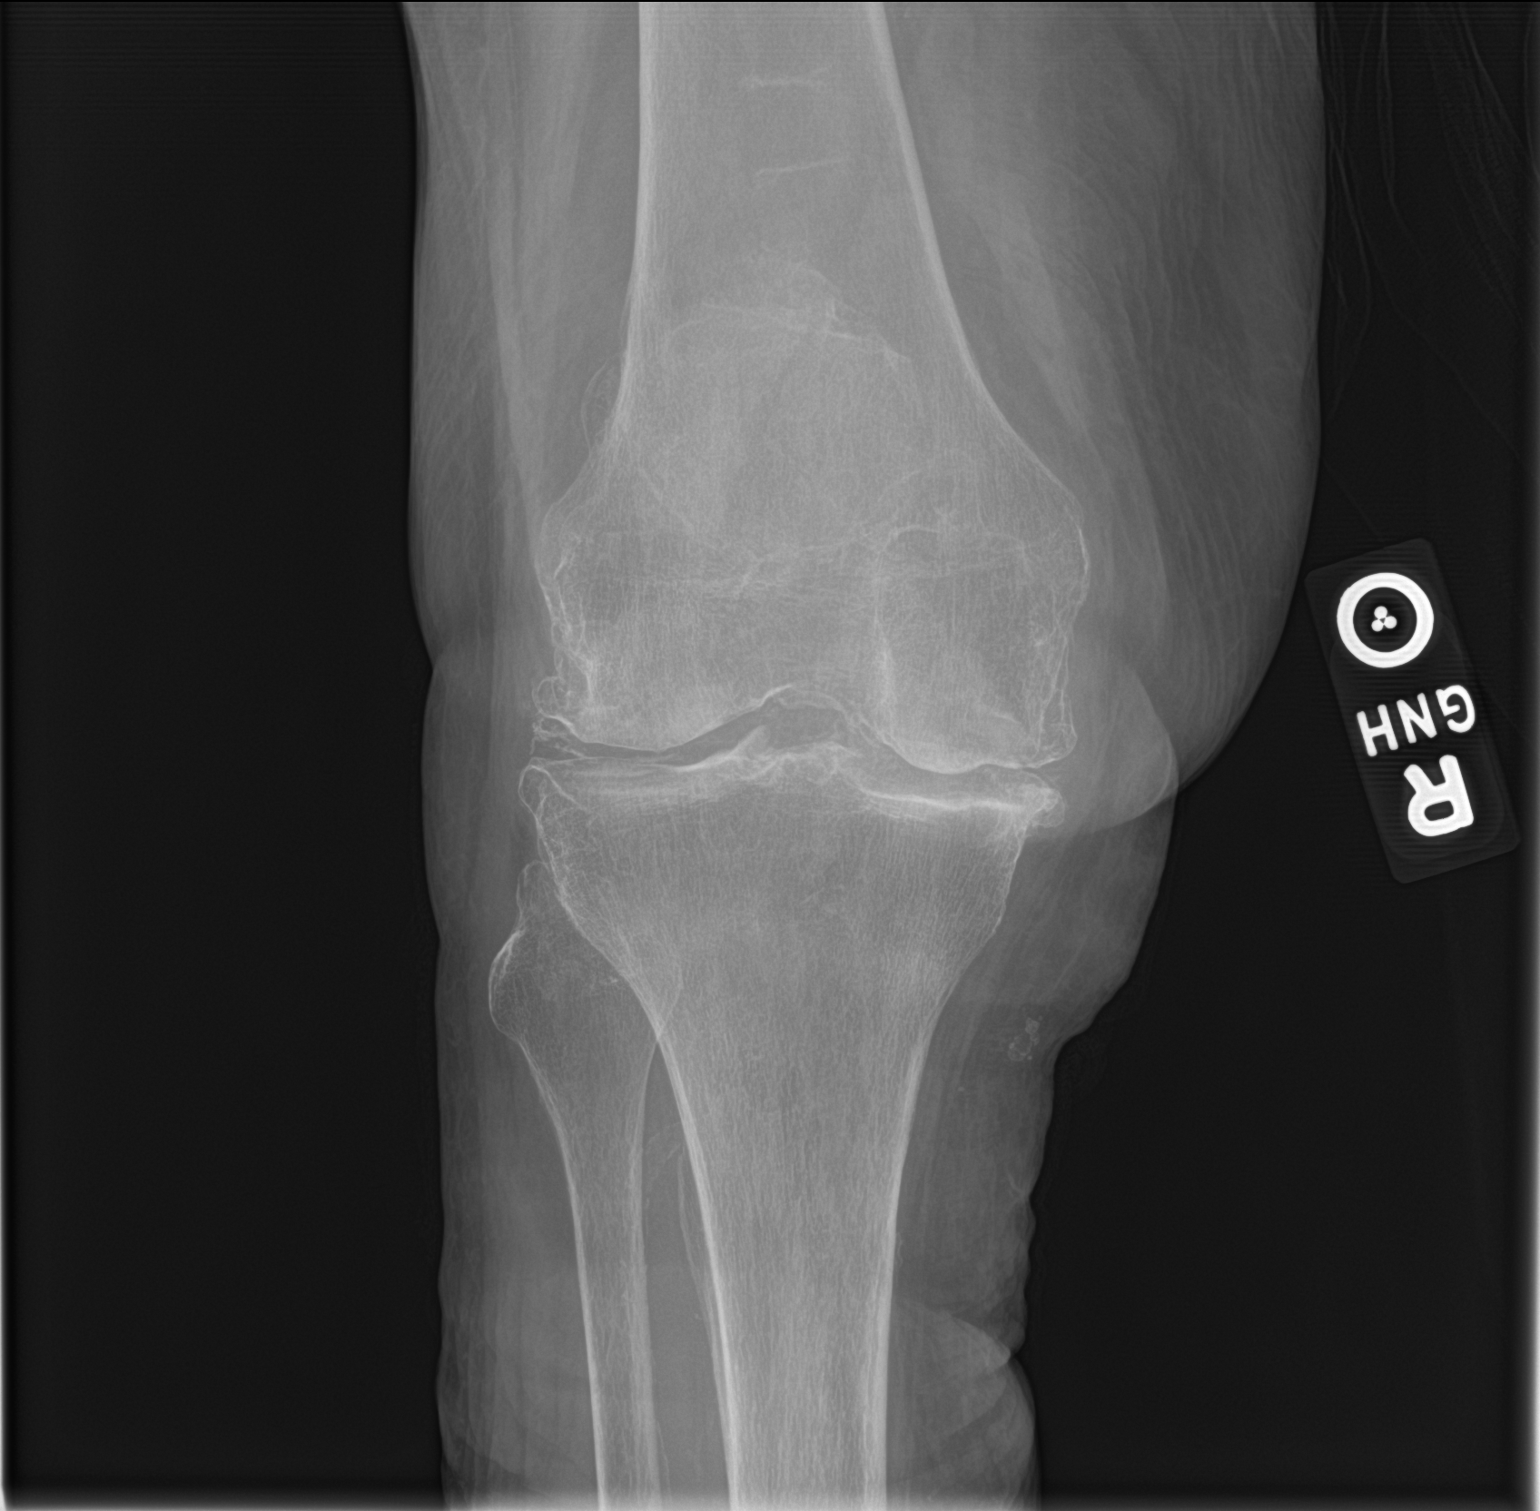

[knee lat]
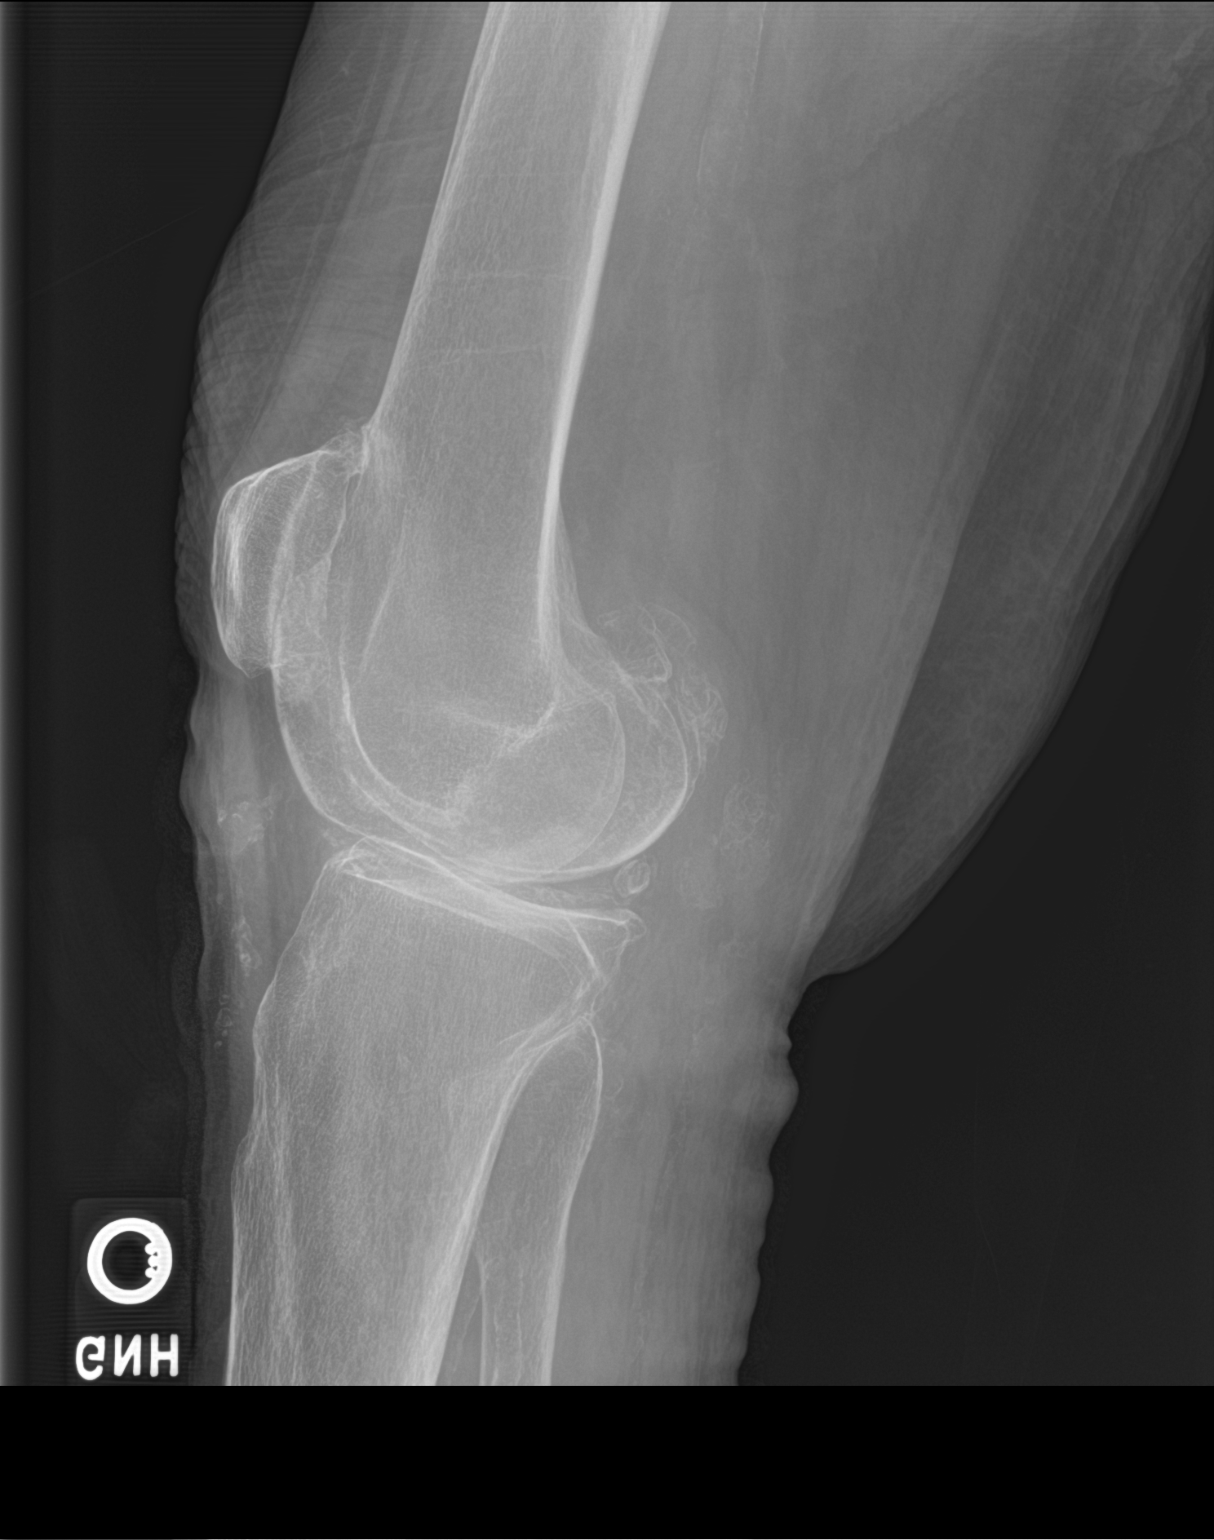

[2 of 2 positions shown; findings below may reference images not displayed]

FINDINGS: Tricompartmental degenerative changes are noted. No acute fracture
or dislocation is seen. No joint effusion is noted.
IMPRESSION: Tricompartmental degenerative changes somewhat progressed when
compared with the prior exam. No acute abnormality noted.

## 2020-06-29 IMAGING — DX PORTABLE CHEST - 1 VIEW
1 series · 1 of 1 positions shown · non-contrast
Comparison: 07/03/2009

CLINICAL DATA: Shortness of breath

EXAM:
PORTABLE CHEST 1 VIEW

[chest ap]
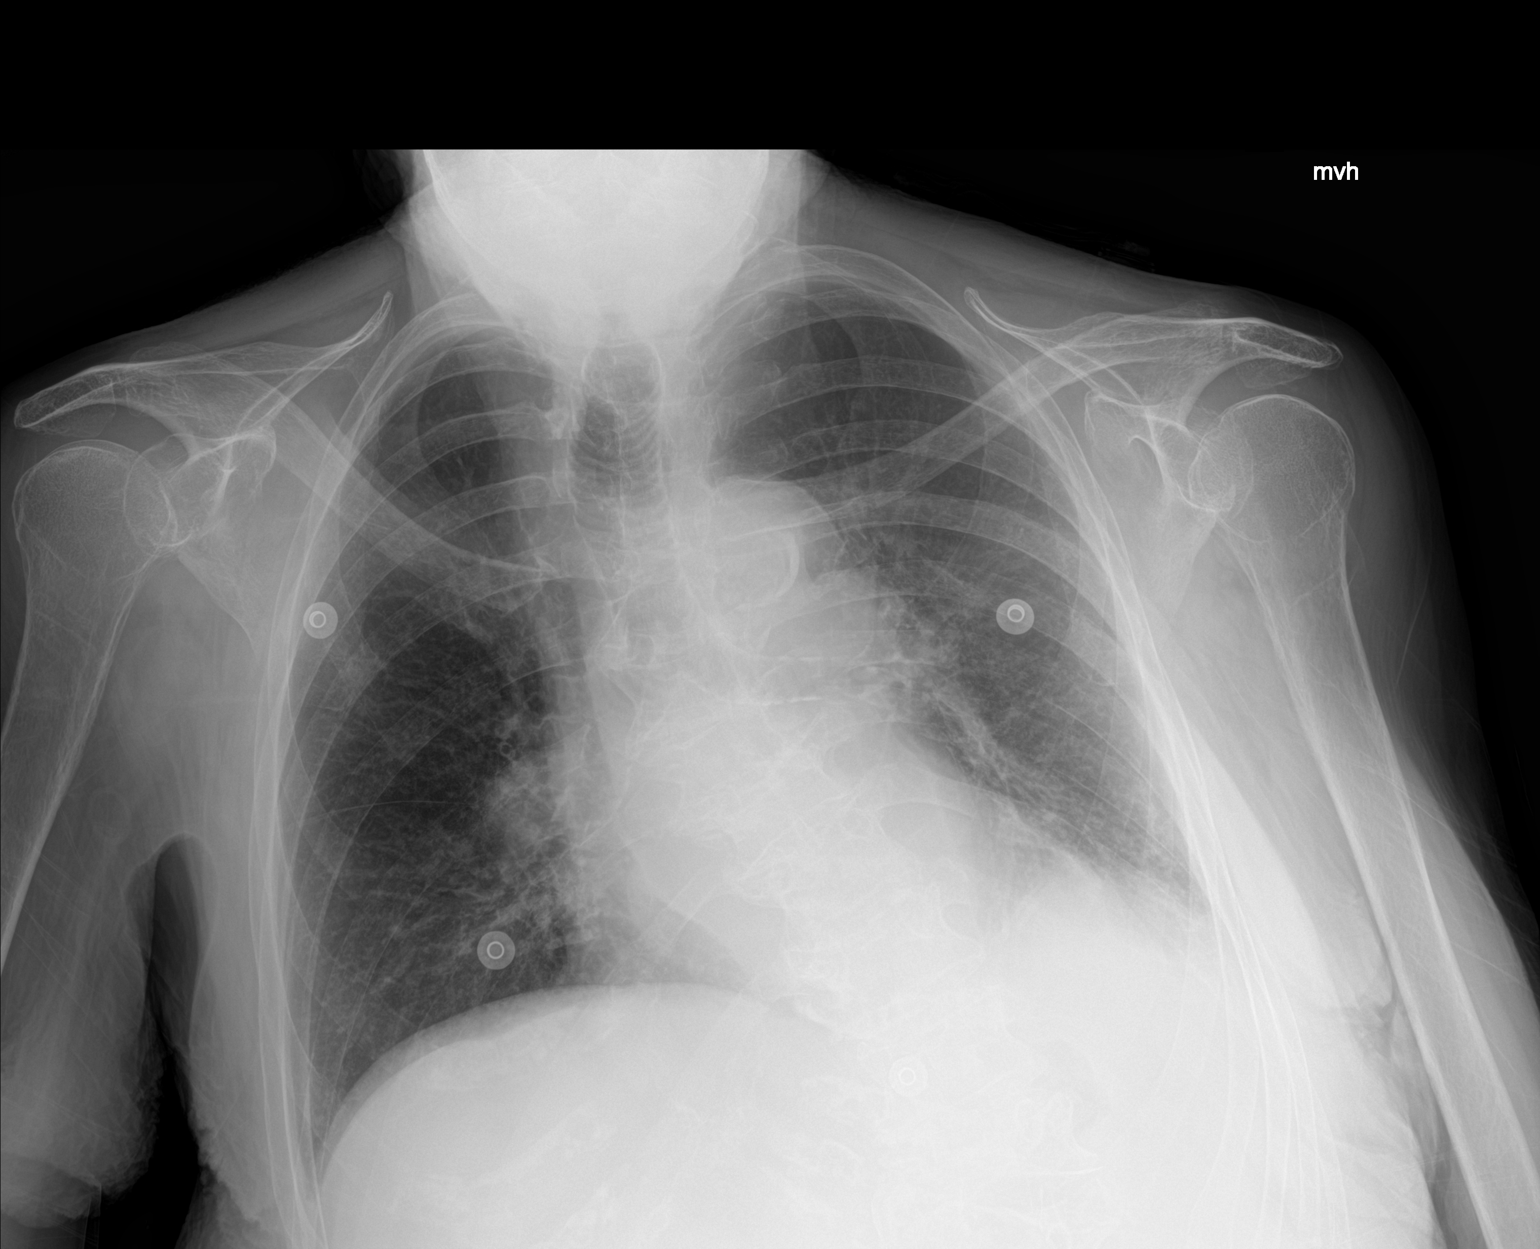

[1 of 1 positions shown; findings below may reference images not displayed]

FINDINGS: Patchy airspace disease at the left base. Stable cardiomediastinal
silhouette with aortic atherosclerosis. No pneumothorax. Scoliosis
of the spine.
IMPRESSION: Patchy airspace disease at the left lung base which may reflect
atelectasis or a pneumonia.

## 2020-08-05 IMAGING — DX PORTABLE CHEST - 1 VIEW
1 series · 1 of 1 positions shown · non-contrast
Comparison: 09/10/2018

CLINICAL DATA: Fall.

EXAM:
PORTABLE CHEST 1 VIEW

[chest]
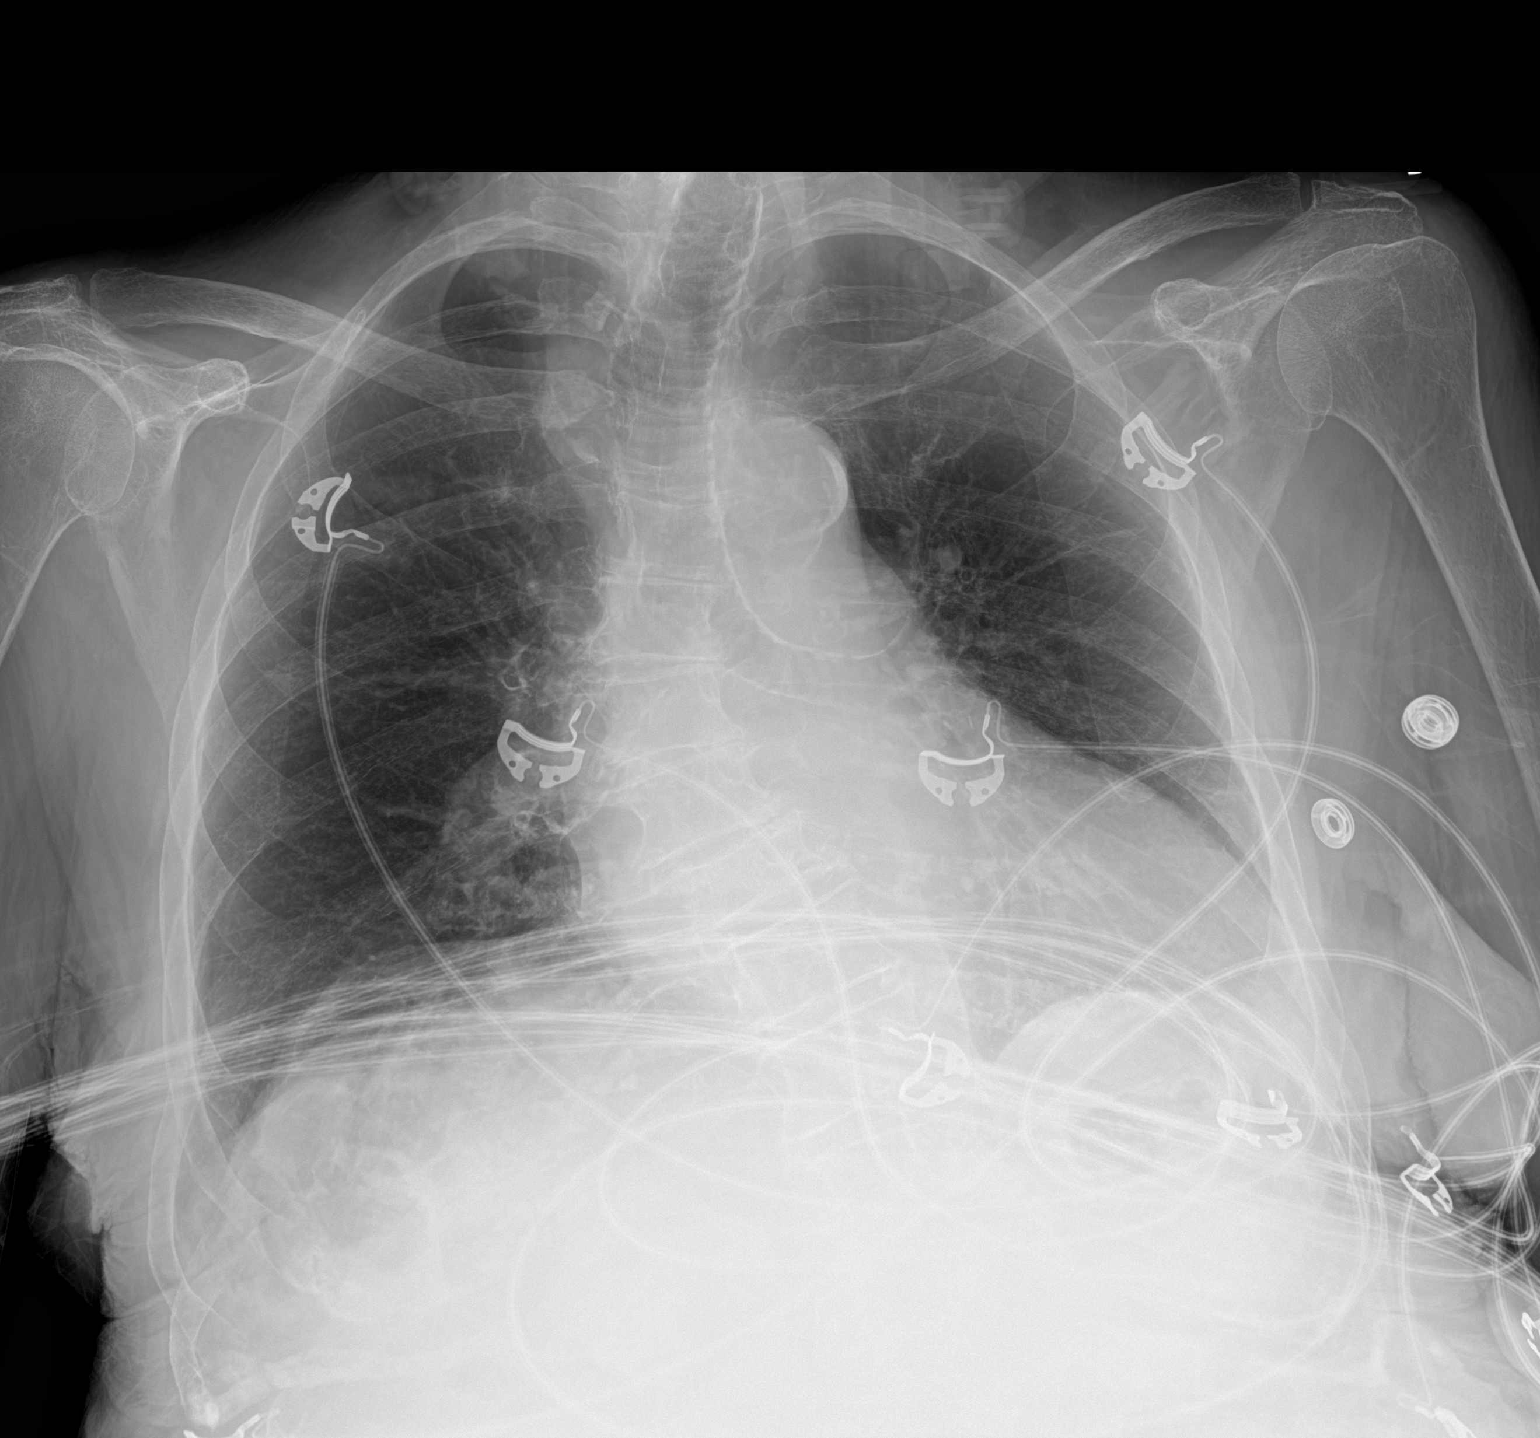

[1 of 1 positions shown; findings below may reference images not displayed]

FINDINGS: Stable cardiac enlargement and aortic atherosclerosis. No pleural
effusion or edema. No airspace opacities. Marked thoracic scoliosis
and degenerative disc disease.
IMPRESSION: 1. No active cardiopulmonary abnormalities.
2.  Aortic Atherosclerosis ([id number]).
